# Patient Record
Sex: Female | Born: 1985 | Race: Black or African American | Hispanic: No | Marital: Single | Smoking: Current every day smoker
Health system: Southern US, Community
[De-identification: ages and names within clinical notes are randomized; demographics above are authoritative.]

## PROBLEM LIST (undated history)

## (undated) DIAGNOSIS — D490 Neoplasm of unspecified behavior of digestive system: Secondary | ICD-10-CM

## (undated) DIAGNOSIS — G8929 Other chronic pain: Secondary | ICD-10-CM

## (undated) DIAGNOSIS — K219 Gastro-esophageal reflux disease without esophagitis: Secondary | ICD-10-CM

## (undated) DIAGNOSIS — R109 Unspecified abdominal pain: Secondary | ICD-10-CM

## (undated) HISTORY — PX: PANCREATECTOMY: SHX1019

## (undated) HISTORY — PX: SPLENECTOMY: SUR1306

## (undated) HISTORY — PX: CHOLECYSTECTOMY: SHX55

---

## 2013-06-06 ENCOUNTER — Emergency Department (HOSPITAL_COMMUNITY)
Admission: EM | Admit: 2013-06-06 | Discharge: 2013-06-06 | Disposition: A | Discharge status: Home / Self Care | Attending: Emergency Medicine | Admitting: Emergency Medicine

## 2013-06-06 ENCOUNTER — Encounter (HOSPITAL_COMMUNITY): Payer: Self-pay | Admitting: *Deleted

## 2013-06-06 ENCOUNTER — Emergency Department (INDEPENDENT_AMBULATORY_CARE_PROVIDER_SITE_OTHER)
Admission: EM | Admit: 2013-06-06 | Discharge: 2013-06-06 | Disposition: A | Discharge status: Skilled Nursing Facility | Source: Home / Self Care | Attending: Family Medicine | Admitting: Family Medicine

## 2013-06-06 ENCOUNTER — Emergency Department (HOSPITAL_COMMUNITY)

## 2013-06-06 DIAGNOSIS — R1012 Left upper quadrant pain: Secondary | ICD-10-CM | POA: Insufficient documentation

## 2013-06-06 DIAGNOSIS — K219 Gastro-esophageal reflux disease without esophagitis: Secondary | ICD-10-CM | POA: Insufficient documentation

## 2013-06-06 DIAGNOSIS — R109 Unspecified abdominal pain: Secondary | ICD-10-CM

## 2013-06-06 DIAGNOSIS — R142 Eructation: Secondary | ICD-10-CM | POA: Insufficient documentation

## 2013-06-06 DIAGNOSIS — R110 Nausea: Secondary | ICD-10-CM | POA: Insufficient documentation

## 2013-06-06 DIAGNOSIS — K921 Melena: Secondary | ICD-10-CM | POA: Insufficient documentation

## 2013-06-06 DIAGNOSIS — R141 Gas pain: Secondary | ICD-10-CM | POA: Insufficient documentation

## 2013-06-06 DIAGNOSIS — F17200 Nicotine dependence, unspecified, uncomplicated: Secondary | ICD-10-CM | POA: Insufficient documentation

## 2013-06-06 DIAGNOSIS — G8929 Other chronic pain: Secondary | ICD-10-CM | POA: Insufficient documentation

## 2013-06-06 DIAGNOSIS — R42 Dizziness and giddiness: Secondary | ICD-10-CM | POA: Insufficient documentation

## 2013-06-06 DIAGNOSIS — Z3202 Encounter for pregnancy test, result negative: Secondary | ICD-10-CM | POA: Insufficient documentation

## 2013-06-06 HISTORY — DX: Neoplasm of unspecified behavior of digestive system: D49.0

## 2013-06-06 HISTORY — DX: Gastro-esophageal reflux disease without esophagitis: K21.9

## 2013-06-06 HISTORY — DX: Unspecified abdominal pain: R10.9

## 2013-06-06 HISTORY — DX: Other chronic pain: G89.29

## 2013-06-06 LAB — CBC WITH DIFFERENTIAL/PLATELET
Basophils Absolute: 0.1 10*3/uL (ref 0.0–0.1)
Basophils Relative: 1 % (ref 0–1)
Eosinophils Absolute: 0.5 10*3/uL (ref 0.0–0.7)
Eosinophils Relative: 6 % — ABNORMAL HIGH (ref 0–5)
Lymphocytes Relative: 61 % — ABNORMAL HIGH (ref 12–46)
MCHC: 33.2 g/dL (ref 30.0–36.0)
MCV: 76.2 fL — ABNORMAL LOW (ref 78.0–100.0)
Platelets: 389 10*3/uL (ref 150–400)
RDW: 18.2 % — ABNORMAL HIGH (ref 11.5–15.5)
WBC: 9.3 10*3/uL (ref 4.0–10.5)

## 2013-06-06 LAB — POCT URINALYSIS DIP (DEVICE)
Glucose, UA: NEGATIVE mg/dL
Hgb urine dipstick: NEGATIVE
Nitrite: NEGATIVE
Protein, ur: NEGATIVE mg/dL
Urobilinogen, UA: 0.2 mg/dL (ref 0.0–1.0)

## 2013-06-06 LAB — COMPREHENSIVE METABOLIC PANEL
ALT: 10 U/L (ref 0–35)
AST: 19 U/L (ref 0–37)
Albumin: 4.6 g/dL (ref 3.5–5.2)
CO2: 24 mEq/L (ref 19–32)
Calcium: 10 mg/dL (ref 8.4–10.5)
Sodium: 135 mEq/L (ref 135–145)
Total Protein: 8.8 g/dL — ABNORMAL HIGH (ref 6.0–8.3)

## 2013-06-06 LAB — SEDIMENTATION RATE: Sed Rate: 3 mm/hr (ref 0–22)

## 2013-06-06 MED ORDER — IOHEXOL 300 MG/ML  SOLN
25.0000 mL | INTRAMUSCULAR | Status: AC
  Administered 2013-06-06: 25 mL via ORAL

## 2013-06-06 MED ORDER — ONDANSETRON HCL 4 MG/2ML IJ SOLN
4.0000 mg | Freq: Once | INTRAMUSCULAR | Status: AC
  Administered 2013-06-06: 4 mg via INTRAVENOUS
  Filled 2013-06-06: qty 2

## 2013-06-06 MED ORDER — MORPHINE SULFATE 4 MG/ML IJ SOLN
4.0000 mg | Freq: Once | INTRAMUSCULAR | Status: AC
  Administered 2013-06-06: 4 mg via INTRAVENOUS
  Filled 2013-06-06: qty 1

## 2013-06-06 MED ORDER — IOHEXOL 300 MG/ML  SOLN
100.0000 mL | Freq: Once | INTRAMUSCULAR | Status: AC | PRN
  Administered 2013-06-06: 100 mL via INTRAVENOUS

## 2013-06-06 MED ORDER — ONDANSETRON HCL 4 MG PO TABS: 4.0000 mg | ORAL_TABLET | Freq: Four times a day (QID) | ORAL | Status: DC

## 2013-06-06 NOTE — ED Notes (Signed)
Has been having some LUQ abd pain, but became significantly worse over past 2 days.  Has hx of large abdominal surgery for tumor removal in 07/2012 - was told afterwards non-cancerous.  Has some chronic abd pain.

## 2013-06-06 NOTE — ED Notes (Signed)
Pt up to the bathroom. No distress noted.

## 2013-06-06 NOTE — ED Notes (Addendum)
Urine was taken at [facility].  poc preg was negative

## 2013-06-06 NOTE — ED Provider Notes (Signed)
I saw and evaluated this patient. Examine her abdomen. It is benign. She does not have peritoneal irritation. Her labs and CT scan are reassuring. Plan will be expectant management and followup with her primary care physician.  Claudean Kinds, MD 06/06/13 2028

## 2013-06-06 NOTE — ED Provider Notes (Signed)
CSN: [id number]     Arrival date & time 06/06/13  1442 History   None    Chief Complaint  Patient presents with  . Abdominal Pain   (Consider location/radiation/quality/duration/timing/severity/associated sxs/prior Treatment) Patient is a 27 y.o. female presenting with abdominal pain. The history is provided by the patient. No language interpreter was used.  Abdominal Pain Pain location:  LUQ Pain quality: sharp   Pain radiates to:  Does not radiate Pain severity:  Moderate Onset quality:  Gradual Duration:  2 days Timing:  Constant Progression:  Worsening Chronicity:  Recurrent Context: previous surgery   Context: not recent illness, not recent travel, not sick contacts, not suspicious food intake and not trauma   Relieved by: splinting. Worsened by:  Nothing tried Ineffective treatments:  None tried Associated symptoms: flatus and nausea   Associated symptoms: no chest pain, no cough, no diarrhea, no dysuria, no fever, no hematuria, no shortness of breath, no sore throat, no vaginal bleeding, no vaginal discharge and no vomiting   Risk factors comment:  Previous pancreatectomy, splenectomy for pancreatic cyst   Past Medical History  Diagnosis Date  . Pancreatic tumor   . GERD (gastroesophageal reflux disease)   . Chronic abdominal pain    Past Surgical History  Procedure Laterality Date  . Splenectomy    . Cholecystectomy    . Pancreatectomy      partial   No family history on file. History  Substance Use Topics  . Smoking status: Current Every Day Smoker  . Smokeless tobacco: Not on file  . Alcohol Use: No   OB History   Grav Para Term Preterm Abortions TAB SAB Ect Mult Living                 Review of Systems  Constitutional: Negative for fever.  HENT: Negative for congestion, sore throat and rhinorrhea.   Respiratory: Negative for cough and shortness of breath.   Cardiovascular: Negative for chest pain.  Gastrointestinal: Positive for nausea, abdominal  pain, blood in stool and flatus. Negative for vomiting and diarrhea.  Genitourinary: Negative for dysuria, hematuria, vaginal bleeding and vaginal discharge.  Skin: Negative for rash.  Neurological: Positive for light-headedness. Negative for syncope and headaches.  All other systems reviewed and are negative.    Allergies  Hydrocodone  Home Medications   Current Outpatient Rx  Name  Route  Sig  Dispense  Refill  . DIAZEPAM PO   Oral   Take by mouth.         . Esomeprazole Magnesium (NEXIUM PO)   Oral   Take by mouth.         Marland Kitchen UNKNOWN TO PATIENT      Liquid pain med          BP 118/85  Pulse 58  Temp(Src) 98.8 F (37.1 C) (Oral)  Resp 18  SpO2 100%  LMP 05/15/2013 Physical Exam  Nursing note and vitals reviewed. Constitutional: She is oriented to person, place, and time. She appears well-developed and well-nourished.  HENT:  Head: Normocephalic and atraumatic.  Right Ear: External ear normal.  Left Ear: External ear normal.  Eyes: EOM are normal.  Neck: Normal range of motion. Neck supple.  Cardiovascular: Normal rate, regular rhythm and intact distal pulses.  Exam reveals no gallop and no friction rub.   No murmur heard. Pulmonary/Chest: Effort normal and breath sounds normal. No respiratory distress. She has no wheezes. She has no rales. She exhibits no tenderness.  Abdominal: Soft. Bowel  sounds are normal. She exhibits no distension. There is tenderness (LUQ). There is no rebound.  Musculoskeletal: Normal range of motion. She exhibits no edema and no tenderness.  Lymphadenopathy:    She has no cervical adenopathy.  Neurological: She is alert and oriented to person, place, and time.  Skin: Skin is warm. No rash noted.  Psychiatric: She has a normal mood and affect. Her behavior is normal.    ED Course  Procedures (including critical care time) Labs Review Labs Reviewed  CBC WITH DIFFERENTIAL - Abnormal; Notable for the following:    RBC 5.26 (*)     MCV 76.2 (*)    MCH 25.3 (*)    RDW 18.2 (*)    Neutrophils Relative % 26 (*)    Lymphocytes Relative 61 (*)    Lymphs Abs 5.7 (*)    Eosinophils Relative 6 (*)    All other components within normal limits  COMPREHENSIVE METABOLIC PANEL - Abnormal; Notable for the following:    Potassium 3.4 (*)    Total Protein 8.8 (*)    All other components within normal limits  LIPASE, BLOOD  SEDIMENTATION RATE  OCCULT BLOOD GASTRIC / DUODENUM (SPECIMEN CUP)   Imaging Review Ct Abdomen Pelvis W Contrast  06/06/2013   CLINICAL DATA:  Left-sided abdominal pain and rectal bleeding for the past 2-3 days. Nausea and vomiting.  EXAM: CT ABDOMEN AND PELVIS WITH CONTRAST  TECHNIQUE: Multidetector CT imaging of the abdomen and pelvis was performed using the standard protocol following bolus administration of intravenous contrast.  CONTRAST:  OMNIPAQUE IOHEXOL 300 MG/ML  SOLN  COMPARISON:  No priors.  FINDINGS: Lung Bases: Unremarkable.  Abdomen/Pelvis: Status post cholecystectomy, distal pancreatectomy and splenectomy. The appearance of the liver, bilateral adrenal glands and the left kidney is unremarkable. In the interpolar collecting system of the right kidney there is a 4 mm nonobstructive calculus. Small volume of free fluid in the cul-de-sac is presumably physiologic in this young female patient. Probable degenerating corpus luteum cyst in the left ovary incidentally noted. Uterus and right ovary are unremarkable in appearance. No larger volume of ascites. No pneumoperitoneum. No pathologic distention of small bowel. Normal appendix. No definite pathologic lymphadenopathy identified within the abdomen or pelvis.  Musculoskeletal: There are no aggressive appearing lytic or blastic lesions noted in the visualized portions of the skeleton.  IMPRESSION: 1. No acute findings in the abdomen or pelvis to account for the patient's symptoms. 2. Status post cholecystectomy, distal pancreatectomy and splenectomy. 3.  Normal appendix. 4. Additional incidental findings, as above.   Electronically Signed   By: Trudie Reed M.D.   On: 06/06/2013 18:33    MDM   1. Abdominal pain   2. Nausea    4:07 PM Pt is a 27 y.o. 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 Abdominal pain for 3 days. Described as sharp in nature without radiation. Pain located in LUQ, constant with no relieving factors and worse with nothing. Pt endorses nausea. No vomiting and no diarrhea. Dark red blood of teaspoon amount in stool off and on, no pain with defecation. No history of constipation or hemorrhoids. No Fevers. Last BM today.Not on medications that increase constipation. No family history of UC or Crohn's  On exam: AFVSS: abdomen soft, TTP LUQ. No peritoneal signs. CBC, CMP, lipase,Will obtain CT abdomen pelvis w contrast to rule out possible splenic bed abscess, UC/Crohn's or diverticulosis/Diverticulitis.  Will give morphine and zofran for pain and nausea will also add on ESR  Review of labs: CBc showed no leukocytosis, H&H 13.3/40.1. CMp showed hypokalemia. No elevation of LFTs. Lipase within normal limits. POCT stool hemoccult negative. Pain controlled with morphine. ESR negative  CT abdomen pelvis negative for colitis, UC/Crohn's or splenic bed abscess. No appendicitis noted. Plan for discharge with close follow up with PCP for possible GI outpatient follow up.  8:13 PM: I have discussed the diagnosis/risks/treatment options with the patient and believe the pt to be eligible for discharge home to follow-up with PCP in 1 week for possible outpatient GI follow up. We also discussed returning to the ED immediately if new or worsening sx occur. We discussed the sx which are most concerning (e.g., worsening symptoms) that necessitate immediate return. Any new prescriptions provided to the patient are listed below.   New  Prescriptions   ONDANSETRON (ZOFRAN) 4 MG TABLET    Take 1 tablet (4 mg total) by mouth every 6 (six) hours.   The patient appears reasonably screened and/or stabilized for discharge and I doubt any other medical condition or other [facility] requiring further screening, evaluation or treatment in the ED at this time prior to discharge . Pt in agreement with discharge plan. Return precautions given. Pt discharged VSS  Labs and imaging reviewed by myself and considered in medical decision making if ordered.  Imaging interpreted by radiology. Pt was discussed with my attending, Dr. Marlene Bast, MD 06/06/13 [phone]

## 2013-06-06 NOTE — ED Provider Notes (Signed)
[name] [name] is a 27 y.o. female who presents to Urgent Care today for abdominal pain. 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 She had a splenectomy cholecystectomy and partial pancreas removal in November 2013 at [place].  This reportedly was curative. She did not have chemotherapy or radiation therapy.  Her surgical course was complicated with a prolonged Penrose drain that took about one month to stop draining. However since December she has had only mild occasional abdominal pain. Over the last several days her abdominal pain has worsened and is now associated with bright red blood per rectum.  She denies any fevers or chills nausea vomiting or diarrhea. She has not yet established with a Dr. here in [place].  The surgery was performed when she was a member of the [employer]. 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 The abdominal pain is located in the left upper quadrant and left lower quadrants. The pain is now moderate to severe.    Past Medical History  Diagnosis Date  . Pancreatic tumor   . GERD (gastroesophageal reflux disease)   . Chronic abdominal pain    History  Substance Use Topics  . Smoking status: Current Every Day Smoker  . Smokeless tobacco: Not on file  . Alcohol Use: No   ROS as above Medications reviewed. No current facility-administered medications for this encounter.   Current Outpatient Prescriptions  Medication Sig Dispense Refill  . DIAZEPAM PO Take by mouth.      . Esomeprazole Magnesium (NEXIUM PO) Take by mouth.      Marland Kitchen UNKNOWN TO PATIENT Liquid pain med        Exam:  BP 149/99  Pulse 61  Temp(Src) 98.5 F (36.9 C) (Oral)  Resp 16  SpO2 99%  LMP 05/15/2013  Gen: Well NAD HEENT: EOMI,  MMM Lungs: CTABL Nl WOB Heart: RRR no MRG Abd: Hypoactive bowel sounds. Large mature scar present on the upper left portion of her abdomen. Tender to palpation upper left quadrant with rebound and  guarding present.  Exts: Non edematous BL  LE, warm and well perfused.   Results for orders placed during the hospital encounter of 06/06/13 (from the past 24 hour(s))  POCT URINALYSIS DIP (DEVICE)     Status: Abnormal   Collection Time    06/06/13  2:12 PM      Result Value Range   Glucose, UA NEGATIVE  NEGATIVE mg/dL   Bilirubin Urine NEGATIVE  NEGATIVE   Ketones, ur NEGATIVE  NEGATIVE mg/dL   Specific Gravity, Urine 1.015  1.005 - 1.030   Hgb urine dipstick NEGATIVE  NEGATIVE   pH 7.0  5.0 - 8.0   Protein, ur NEGATIVE  NEGATIVE mg/dL   Urobilinogen, UA 0.2  0.0 - 1.0 mg/dL   Nitrite NEGATIVE  NEGATIVE   Leukocytes, UA TRACE (*) NEGATIVE  POCT PREGNANCY, URINE     Status: None   Collection Time    06/06/13  2:34 PM      Result Value Range   Preg Test, Ur NEGATIVE  NEGATIVE   No results found.  Assessment and Plan: 27 y.o. female with worsening abdominal pain, blood in stool in the setting of an extensive surgical procedure for a unknown tumor.  Patient's exam is somewhat concerning. I feel that she needs more evaluation and management than we are able to provide at the [facility] urgent care. She may have a bile leak, infection, adhesion, colitis  or malignant spread.  Plan to transfer to the emergency room via shuttle.    Rodolph Bong, MD 06/06/13 [phone]

## 2013-06-06 NOTE — ED Notes (Signed)
Pt has had 2-3 days of left sided abdominal pain with rectal bleeding (dark blood with bm x1 this am). Pt had some nausea and vomiting Friday.  Pt has hx splenectomy and "half my pancreas taken out" in 11/13 and pt feels that her symptoms at this time are just like before her surgeries.

## 2013-06-07 LAB — POCT PREGNANCY, URINE: Preg Test, Ur: NEGATIVE

## 2013-06-16 NOTE — ED Provider Notes (Signed)
.  I saw and evaluated the patient, reviewed the resident's note and I agree with the findings and plan.  Patient seen and examined. Primarily nausea and some diffuse abdominal pain. Significant history with splenectomy and pancreatitis. Exam shows no peritoneal irritation. Findings are reassuring. His improving. Plan will be close attention if symptoms recheck if not improving or if worsening or localizing pain.  Roney Marion, MD 06/16/13 1059

## 2013-10-27 ENCOUNTER — Encounter (HOSPITAL_COMMUNITY): Payer: Self-pay | Admitting: Emergency Medicine

## 2013-10-27 DIAGNOSIS — Z3202 Encounter for pregnancy test, result negative: Secondary | ICD-10-CM | POA: Insufficient documentation

## 2013-10-27 DIAGNOSIS — F17200 Nicotine dependence, unspecified, uncomplicated: Secondary | ICD-10-CM | POA: Insufficient documentation

## 2013-10-27 DIAGNOSIS — Z79899 Other long term (current) drug therapy: Secondary | ICD-10-CM | POA: Insufficient documentation

## 2013-10-27 DIAGNOSIS — N390 Urinary tract infection, site not specified: Secondary | ICD-10-CM | POA: Insufficient documentation

## 2013-10-27 DIAGNOSIS — K219 Gastro-esophageal reflux disease without esophagitis: Secondary | ICD-10-CM | POA: Insufficient documentation

## 2013-10-27 DIAGNOSIS — K5289 Other specified noninfective gastroenteritis and colitis: Secondary | ICD-10-CM | POA: Insufficient documentation

## 2013-10-27 DIAGNOSIS — Z9089 Acquired absence of other organs: Secondary | ICD-10-CM | POA: Insufficient documentation

## 2013-10-27 MED ORDER — ONDANSETRON 4 MG PO TBDP
8.0000 mg | ORAL_TABLET | Freq: Once | ORAL | Status: AC
  Administered 2013-10-27: 8 mg via ORAL
  Filled 2013-10-27: qty 2

## 2013-10-27 NOTE — ED Notes (Signed)
Pt vomiting in waiting room; emesis bag given and placed protocol order for zofran

## 2013-10-27 NOTE — ED Notes (Signed)
Patient states she has been vomiting off and on for 3 days and today it got worse

## 2013-10-28 ENCOUNTER — Emergency Department (HOSPITAL_COMMUNITY)
Admission: EM | Admit: 2013-10-28 | Discharge: 2013-10-28 | Disposition: A | Discharge status: Home / Self Care | Payer: Non-veteran care | Attending: Emergency Medicine | Admitting: Emergency Medicine

## 2013-10-28 DIAGNOSIS — R112 Nausea with vomiting, unspecified: Secondary | ICD-10-CM

## 2013-10-28 DIAGNOSIS — N390 Urinary tract infection, site not specified: Secondary | ICD-10-CM

## 2013-10-28 DIAGNOSIS — K529 Noninfective gastroenteritis and colitis, unspecified: Secondary | ICD-10-CM

## 2013-10-28 LAB — URINE MICROSCOPIC-ADD ON

## 2013-10-28 LAB — COMPREHENSIVE METABOLIC PANEL
ALBUMIN: 4.1 g/dL (ref 3.5–5.2)
ALT: 14 U/L (ref 0–35)
AST: 17 U/L (ref 0–37)
Alkaline Phosphatase: 75 U/L (ref 39–117)
BUN: 15 mg/dL (ref 6–23)
CALCIUM: 9.1 mg/dL (ref 8.4–10.5)
CO2: 19 meq/L (ref 19–32)
CREATININE: 0.64 mg/dL (ref 0.50–1.10)
Chloride: 101 mEq/L (ref 96–112)
GFR calc Af Amer: >90 mL/min (ref >90)
Glucose, Bld: 121 mg/dL — ABNORMAL HIGH (ref 70–99)
Potassium: 3.7 mEq/L (ref 3.7–5.3)
Sodium: 137 mEq/L (ref 137–147)
TOTAL PROTEIN: 7.8 g/dL (ref 6.0–8.3)
Total Bilirubin: 0.7 mg/dL (ref 0.3–1.2)

## 2013-10-28 LAB — URINALYSIS, ROUTINE W REFLEX MICROSCOPIC
Bilirubin Urine: NEGATIVE
Glucose, UA: NEGATIVE mg/dL
KETONES UR: 40 mg/dL — AB
NITRITE: POSITIVE — AB
PH: 5.5 (ref 5.0–8.0)
PROTEIN: NEGATIVE mg/dL
Specific Gravity, Urine: 1.03 (ref 1.005–1.030)
Urobilinogen, UA: 0.2 mg/dL (ref 0.0–1.0)

## 2013-10-28 LAB — LIPASE, BLOOD: Lipase: 15 U/L (ref 11–59)

## 2013-10-28 LAB — CBC
HCT: 37.1 % (ref 36.0–46.0)
Hemoglobin: 13 g/dL (ref 12.0–15.0)
MCH: 27.8 pg (ref 26.0–34.0)
MCHC: 35 g/dL (ref 30.0–36.0)
MCV: 79.4 fL (ref 78.0–100.0)
PLATELETS: 325 10*3/uL (ref 150–400)
RBC: 4.67 MIL/uL (ref 3.87–5.11)
RDW: 15.6 % — AB (ref 11.5–15.5)
WBC: 9.4 10*3/uL (ref 4.0–10.5)

## 2013-10-28 LAB — PREGNANCY, URINE: PREG TEST UR: NEGATIVE

## 2013-10-28 MED ORDER — ONDANSETRON 4 MG PO TBDP
ORAL_TABLET | ORAL | Status: AC
  Filled 2013-10-28: qty 1

## 2013-10-28 MED ORDER — CEPHALEXIN 500 MG PO CAPS: 500.0000 mg | ORAL_CAPSULE | Freq: Four times a day (QID) | ORAL | Status: DC

## 2013-10-28 MED ORDER — ONDANSETRON 4 MG PO TBDP
4.0000 mg | ORAL_TABLET | Freq: Once | ORAL | Status: AC
  Administered 2013-10-28: 4 mg via ORAL

## 2013-10-28 MED ORDER — SODIUM CHLORIDE 0.9 % IV BOLUS (SEPSIS)
1000.0000 mL | Freq: Once | INTRAVENOUS | Status: AC
  Administered 2013-10-28: 1000 mL via INTRAVENOUS

## 2013-10-28 MED ORDER — ONDANSETRON HCL 8 MG PO TABS: 8.0000 mg | ORAL_TABLET | Freq: Three times a day (TID) | ORAL | Status: DC | PRN

## 2013-10-28 MED ORDER — ONDANSETRON HCL 4 MG/2ML IJ SOLN
4.0000 mg | Freq: Once | INTRAMUSCULAR | Status: AC
  Administered 2013-10-28: 4 mg via INTRAVENOUS
  Filled 2013-10-28: qty 2

## 2013-10-28 MED ORDER — SODIUM CHLORIDE 0.9 % IV BOLUS (SEPSIS)
500.0000 mL | Freq: Once | INTRAVENOUS | Status: AC
  Administered 2013-10-28: 500 mL via INTRAVENOUS

## 2013-10-28 NOTE — ED Provider Notes (Signed)
CSN: [id number]     Arrival date & time 10/27/13  2228 History   First MD Initiated Contact with Patient 10/28/13 [phone]     Chief Complaint  Patient presents with  . Emesis   (Consider location/radiation/quality/duration/timing/severity/associated sxs/prior Treatment) Patient is a 28 y.o. female presenting with vomiting. The history is provided by the patient.  Emesis Associated symptoms: no abdominal pain, no chills, no diarrhea, no headaches and no sore throat   pt c/o nv for the past day, several episodes, clear to yellowish, not bloody or bilious. Mid abd crampy discomfort. No constant/focal pain. No diarrhea or constipation, having normal bms. No lower abd or pelvic pain. No vaginal discharge or bleeding. lnmp 1 month ago. No dysuria or flank pain. No fever or chills. No known ill contacts or bad food ingestion.      Past Medical History  Diagnosis Date  . Pancreatic tumor   . GERD (gastroesophageal reflux disease)   . Chronic abdominal pain    Past Surgical History  Procedure Laterality Date  . Splenectomy    . Cholecystectomy    . Pancreatectomy      partial   History reviewed. No pertinent family history. History  Substance Use Topics  . Smoking status: Current Every Day Smoker  . Smokeless tobacco: Not on file  . Alcohol Use: No   OB History   Grav Para Term Preterm Abortions TAB SAB Ect Mult Living                 Review of Systems  Constitutional: Negative for fever and chills.  HENT: Negative for sore throat.   Eyes: Negative for redness.  Respiratory: Negative for cough and shortness of breath.   Cardiovascular: Negative for chest pain.  Gastrointestinal: Positive for vomiting. Negative for abdominal pain, diarrhea and constipation.  Genitourinary: Negative for dysuria, flank pain, vaginal bleeding and vaginal discharge.  Musculoskeletal: Negative for back pain and neck pain.  Skin: Negative for rash.  Neurological: Negative for headaches.   Hematological: Does not bruise/bleed easily.  Psychiatric/Behavioral: Negative for confusion.    Allergies  Strawberry and Hydrocodone  Home Medications   Current Outpatient Rx  Name  Route  Sig  Dispense  Refill  . esomeprazole (NEXIUM) 40 MG capsule   Oral   Take 40 mg by mouth daily before breakfast.          BP 124/74  Pulse 69  Temp(Src) 98.1 F (36.7 C) (Oral)  Resp 18  Ht 5\' 7"  (1.702 m)  Wt 138 lb (62.596 kg)  BMI 21.61 kg/m2  SpO2 99%  LMP 09/25/2013 Physical Exam  Nursing note and vitals reviewed. Constitutional: She appears well-developed and well-nourished. No distress.  HENT:  Mouth/Throat: Oropharynx is clear and moist.  Eyes: Conjunctivae are normal. No scleral icterus.  Neck: Neck supple. No tracheal deviation present.  Cardiovascular: Normal rate, regular rhythm, normal heart sounds and intact distal pulses.   Pulmonary/Chest: Effort normal and breath sounds normal. No respiratory distress.  Abdominal: Soft. Normal appearance and bowel sounds are normal. She exhibits no distension and no mass. There is no tenderness. There is no rebound and no guarding.  Healed surgical scars, no incarcerated hernia.   Genitourinary:  No cva tenderness  Musculoskeletal: She exhibits no edema.  Neurological: She is alert.  Skin: Skin is warm and dry. No rash noted.  Psychiatric: She has a normal mood and affect.    ED Course  Procedures (including critical care time)   Results for  orders placed during the hospital encounter of 10/28/13  CBC      Result Value Range   WBC 9.4  4.0 - 10.5 K/uL   RBC 4.67  3.87 - 5.11 MIL/uL   Hemoglobin 13.0  12.0 - 15.0 g/dL   HCT 37.1  36.0 - 46.0 %   MCV 79.4  78.0 - 100.0 fL   MCH 27.8  26.0 - 34.0 pg   MCHC 35.0  30.0 - 36.0 g/dL   RDW 15.6 (*) 11.5 - 15.5 %   Platelets 325  150 - 400 K/uL  COMPREHENSIVE METABOLIC PANEL      Result Value Range   Sodium 137  137 - 147 mEq/L   Potassium 3.7  3.7 - 5.3 mEq/L    Chloride 101  96 - 112 mEq/L   CO2 19  19 - 32 mEq/L   Glucose, Bld 121 (*) 70 - 99 mg/dL   BUN 15  6 - 23 mg/dL   Creatinine, Ser 0.64  0.50 - 1.10 mg/dL   Calcium 9.1  8.4 - 10.5 mg/dL   Total Protein 7.8  6.0 - 8.3 g/dL   Albumin 4.1  3.5 - 5.2 g/dL   AST 17  0 - 37 U/L   ALT 14  0 - 35 U/L   Alkaline Phosphatase 75  39 - 117 U/L   Total Bilirubin 0.7  0.3 - 1.2 mg/dL   GFR calc non Af Amer >90  >90 mL/min   GFR calc Af Amer >90  >90 mL/min  LIPASE, BLOOD      Result Value Range   Lipase 15  11 - 59 U/L  PREGNANCY, URINE      Result Value Range   Preg Test, Ur NEGATIVE  NEGATIVE  URINALYSIS, ROUTINE W REFLEX MICROSCOPIC      Result Value Range   Color, Urine AMBER (*) YELLOW   APPearance CLOUDY (*) CLEAR   Specific Gravity, Urine 1.030  1.005 - 1.030   pH 5.5  5.0 - 8.0   Glucose, UA NEGATIVE  NEGATIVE mg/dL   Hgb urine dipstick SMALL (*) NEGATIVE   Bilirubin Urine NEGATIVE  NEGATIVE   Ketones, ur 40 (*) NEGATIVE mg/dL   Protein, ur NEGATIVE  NEGATIVE mg/dL   Urobilinogen, UA 0.2  0.0 - 1.0 mg/dL   Nitrite POSITIVE (*) NEGATIVE   Leukocytes, UA TRACE (*) NEGATIVE  URINE MICROSCOPIC-ADD ON      Result Value Range   Squamous Epithelial / LPF RARE  RARE   WBC, UA 0-2  <3 WBC/hpf   RBC / HPF 3-6  <3 RBC/hpf   Bacteria, UA MANY (*) RARE      MDM  Iv ns bolus. zofran iv. Labs.  Reviewed nursing notes and prior charts for additional history.    Additional iv ns bolus.   Recheck abd soft, no tenderness. No recurrent nv.   ua w nitrite pos, le pos, many bact - will cs and rx.   Pt remains feeling improved. abd soft nt. No nv.    Mirna Mires, MD 10/28/13 [phone]

## 2013-10-28 NOTE — ED Notes (Signed)
Pt states that her nausea has "slowed down" since receiving the Zofran. Pt states that she has not thrown up as much since taking Zofran.

## 2013-10-28 NOTE — Discharge Instructions (Signed)
Rest. Drink plenty of fluids. Take zofran as need for nausea. Your lab tests show a possible urine infection. Take antibiotic (keflex) as prescribed. A urine culture was sent the results of which will be back in 2 days time - have primary care doctor follow up on that result then. Follow up with primary care doctor, or here, in 1 days time for recheck if symptoms fail to improve/resolve. Return to ER right away if worse, persistent vomiting, worsening or severe abdominal pain, other concern.    Nausea and Vomiting Nausea is a sick feeling that often comes before throwing up (vomiting). Vomiting is a reflex where stomach contents come out of your mouth. Vomiting can cause severe loss of body fluids (dehydration). Children and elderly adults can become dehydrated quickly, especially if they also have diarrhea. Nausea and vomiting are symptoms of a condition or disease. It is important to find the cause of your symptoms. CAUSES   Direct irritation of the stomach lining. This irritation can result from increased acid production (gastroesophageal reflux disease), infection, food poisoning, taking certain medicines (such as nonsteroidal anti-inflammatory drugs), alcohol use, or tobacco use.  Signals from the brain.These signals could be caused by a headache, heat exposure, an inner ear disturbance, increased pressure in the brain from injury, infection, a tumor, or a concussion, pain, emotional stimulus, or metabolic problems.  An obstruction in the gastrointestinal tract (bowel obstruction).  Illnesses such as diabetes, hepatitis, gallbladder problems, appendicitis, kidney problems, cancer, sepsis, atypical symptoms of a heart attack, or eating disorders.  Medical treatments such as chemotherapy and radiation.  Receiving medicine that makes you sleep (general anesthetic) during surgery. DIAGNOSIS Your caregiver may ask for tests to be done if the problems do not improve after a few days. Tests  may also be done if symptoms are severe or if the reason for the nausea and vomiting is not clear. Tests may include:  Urine tests.  Blood tests.  Stool tests.  Cultures (to look for evidence of infection).  X-rays or other imaging studies. Test results can help your caregiver make decisions about treatment or the need for additional tests. TREATMENT You need to stay well hydrated. Drink frequently but in small amounts.You may wish to drink water, sports drinks, clear broth, or eat frozen ice pops or gelatin dessert to help stay hydrated.When you eat, eating slowly may help prevent nausea.There are also some antinausea medicines that may help prevent nausea. HOME CARE INSTRUCTIONS   Take all medicine as directed by your caregiver.  If you do not have an appetite, do not force yourself to eat. However, you must continue to drink fluids.  If you have an appetite, eat a normal diet unless your caregiver tells you differently.  Eat a variety of complex carbohydrates (rice, wheat, potatoes, bread), lean meats, yogurt, fruits, and vegetables.  Avoid high-fat foods because they are more difficult to digest.  Drink enough water and fluids to keep your urine clear or pale yellow.  If you are dehydrated, ask your caregiver for specific rehydration instructions. Signs of dehydration may include:  Severe thirst.  Dry lips and mouth.  Dizziness.  Dark urine.  Decreasing urine frequency and amount.  Confusion.  Rapid breathing or pulse. SEEK IMMEDIATE MEDICAL CARE IF:   You have blood or brown flecks (like coffee grounds) in your vomit.  You have black or bloody stools.  You have a severe headache or stiff neck.  You are confused.  You have severe abdominal pain.  You  have chest pain or trouble breathing.  You do not urinate at least once every 8 hours.  You develop cold or clammy skin.  You continue to vomit for longer than 24 to 48 hours.  You have a fever. MAKE  SURE YOU:   Understand these instructions.  Will watch your condition.  Will get help right away if you are not doing well or get worse. Document Released: 09/09/2005 Document Revised: 12/02/2011 Document Reviewed: 02/06/2011 [facility] Patient Information 2014 [place], [place].    Urinary Tract Infection Urinary tract infections (UTIs) can develop anywhere along your urinary tract. Your urinary tract is your body's drainage system for removing wastes and extra water. Your urinary tract includes two kidneys, two ureters, a bladder, and a urethra. Your kidneys are a pair of bean-shaped organs. Each kidney is about the size of your fist. They are located below your ribs, one on each side of your spine. CAUSES Infections are caused by microbes, which are microscopic organisms, including fungi, viruses, and bacteria. These organisms are so small that they can only be seen through a microscope. Bacteria are the microbes that most commonly cause UTIs. SYMPTOMS  Symptoms of UTIs may vary by age and gender of the patient and by the location of the infection. Symptoms in young women typically include a frequent and intense urge to urinate and a painful, burning feeling in the bladder or urethra during urination. Older women and men are more likely to be tired, shaky, and weak and have muscle aches and abdominal pain. A fever may mean the infection is in your kidneys. Other symptoms of a kidney infection include pain in your back or sides below the ribs, nausea, and vomiting. DIAGNOSIS To diagnose a UTI, your caregiver will ask you about your symptoms. Your caregiver also will ask to provide a urine sample. The urine sample will be tested for bacteria and white blood cells. White blood cells are made by your body to help fight infection. TREATMENT  Typically, UTIs can be treated with medication. Because most UTIs are caused by a bacterial infection, they usually can be treated with the use of antibiotics.  The choice of antibiotic and length of treatment depend on your symptoms and the type of bacteria causing your infection. HOME CARE INSTRUCTIONS  If you were prescribed antibiotics, take them exactly as your caregiver instructs you. Finish the medication even if you feel better after you have only taken some of the medication.  Drink enough water and fluids to keep your urine clear or pale yellow.  Avoid caffeine, tea, and carbonated beverages. They tend to irritate your bladder.  Empty your bladder often. Avoid holding urine for long periods of time.  Empty your bladder before and after sexual intercourse.  After a bowel movement, women should cleanse from front to back. Use each tissue only once. SEEK MEDICAL CARE IF:   You have back pain.  You develop a fever.  Your symptoms do not begin to resolve within 3 days. SEEK IMMEDIATE MEDICAL CARE IF:   You have severe back pain or lower abdominal pain.  You develop chills.  You have nausea or vomiting.  You have continued burning or discomfort with urination. MAKE SURE YOU:   Understand these instructions.  Will watch your condition.  Will get help right away if you are not doing well or get worse. Document Released: 06/19/2005 Document Revised: 03/10/2012 Document Reviewed: 10/18/2011 [facility] Patient Information 2014 [place].    Abdominal Pain, Adult Many things  can cause abdominal pain. Usually, abdominal pain is not caused by a disease and will improve without treatment. It can often be observed and treated at home. Your health care provider will do a physical exam and possibly order blood tests and X-rays to help determine the seriousness of your pain. However, in many cases, more time must pass before a clear cause of the pain can be found. Before that point, your health care provider may not know if you need more testing or further treatment. HOME CARE INSTRUCTIONS  Monitor your abdominal pain for any changes.  The following actions may help to alleviate any discomfort you are experiencing:  Only take over-the-counter or prescription medicines as directed by your health care provider.  Do not take laxatives unless directed to do so by your health care provider.  Try a clear liquid diet (broth, tea, or water) as directed by your health care provider. Slowly move to a bland diet as tolerated. SEEK MEDICAL CARE IF:  You have unexplained abdominal pain.  You have abdominal pain associated with nausea or diarrhea.  You have pain when you urinate or have a bowel movement.  You experience abdominal pain that wakes you in the night.  You have abdominal pain that is worsened or improved by eating food.  You have abdominal pain that is worsened with eating fatty foods. SEEK IMMEDIATE MEDICAL CARE IF:   Your pain does not go away within 2 hours.  You have a fever.  You keep throwing up (vomiting).  Your pain is felt only in portions of the abdomen, such as the right side or the left lower portion of the abdomen.  You pass bloody or black tarry stools. MAKE SURE YOU:  Understand these instructions.   Will watch your condition.   Will get help right away if you are not doing well or get worse.  Document Released: 06/19/2005 Document Revised: 06/30/2013 Document Reviewed: 05/19/2013 [facility] Patient Information 2014 [place].

## 2013-10-30 LAB — URINE CULTURE: Colony Count: >=100000

## 2013-10-31 ENCOUNTER — Telehealth (HOSPITAL_COMMUNITY): Payer: Self-pay | Admitting: Emergency Medicine

## 2013-10-31 NOTE — ED Notes (Signed)
Post ED Visit - Positive Culture Follow-up  Culture report reviewed by antimicrobial stewardship pharmacist: []  Wes [place], Pharm.D., BCPS [x]  Heide Guile, Pharm.D., BCPS []  Alycia Rossetti, Pharm.D., BCPS []  [place], Pharm.D., BCPS, AAHIVP []  Legrand Como, Pharm.D., BCPS, AAHIVP  Positive urine culture Treated with Keflex, organism sensitive to the same and no further patient follow-up is required at this time.  Matthew Saras, Rex Kras 10/31/2013, 9:50 AM

## 2014-04-23 ENCOUNTER — Emergency Department (HOSPITAL_COMMUNITY)
Admission: EM | Admit: 2014-04-23 | Discharge: 2014-04-23 | Disposition: A | Discharge status: Home / Self Care | Payer: Self-pay | Attending: Emergency Medicine | Admitting: Emergency Medicine

## 2014-04-23 ENCOUNTER — Emergency Department (HOSPITAL_COMMUNITY): Payer: Self-pay

## 2014-04-23 ENCOUNTER — Emergency Department (HOSPITAL_COMMUNITY)

## 2014-04-23 ENCOUNTER — Encounter (HOSPITAL_COMMUNITY): Payer: Self-pay | Admitting: Emergency Medicine

## 2014-04-23 DIAGNOSIS — G8929 Other chronic pain: Secondary | ICD-10-CM | POA: Insufficient documentation

## 2014-04-23 DIAGNOSIS — N83209 Unspecified ovarian cyst, unspecified side: Secondary | ICD-10-CM | POA: Insufficient documentation

## 2014-04-23 DIAGNOSIS — N390 Urinary tract infection, site not specified: Secondary | ICD-10-CM | POA: Insufficient documentation

## 2014-04-23 DIAGNOSIS — R109 Unspecified abdominal pain: Secondary | ICD-10-CM | POA: Insufficient documentation

## 2014-04-23 DIAGNOSIS — Z3202 Encounter for pregnancy test, result negative: Secondary | ICD-10-CM | POA: Insufficient documentation

## 2014-04-23 DIAGNOSIS — Z8719 Personal history of other diseases of the digestive system: Secondary | ICD-10-CM | POA: Insufficient documentation

## 2014-04-23 DIAGNOSIS — Z9889 Other specified postprocedural states: Secondary | ICD-10-CM | POA: Insufficient documentation

## 2014-04-23 DIAGNOSIS — Z9089 Acquired absence of other organs: Secondary | ICD-10-CM | POA: Insufficient documentation

## 2014-04-23 DIAGNOSIS — F17200 Nicotine dependence, unspecified, uncomplicated: Secondary | ICD-10-CM | POA: Insufficient documentation

## 2014-04-23 LAB — WET PREP, GENITAL
Trich, Wet Prep: NONE SEEN
Yeast Wet Prep HPF POC: NONE SEEN

## 2014-04-23 LAB — COMPREHENSIVE METABOLIC PANEL
ALBUMIN: 4.2 g/dL (ref 3.5–5.2)
ALK PHOS: 106 U/L (ref 39–117)
ALT: 9 U/L (ref 0–35)
AST: 15 U/L (ref 0–37)
Anion gap: 16 — ABNORMAL HIGH (ref 5–15)
BUN: 12 mg/dL (ref 6–23)
CO2: 22 mEq/L (ref 19–32)
Calcium: 9.9 mg/dL (ref 8.4–10.5)
Chloride: 94 mEq/L — ABNORMAL LOW (ref 96–112)
Creatinine, Ser: 0.74 mg/dL (ref 0.50–1.10)
GFR calc non Af Amer: >90 mL/min (ref >90)
GLUCOSE: 107 mg/dL — AB (ref 70–99)
POTASSIUM: 4.1 meq/L (ref 3.7–5.3)
Sodium: 132 mEq/L — ABNORMAL LOW (ref 137–147)
TOTAL PROTEIN: 9.2 g/dL — AB (ref 6.0–8.3)
Total Bilirubin: 0.9 mg/dL (ref 0.3–1.2)

## 2014-04-23 LAB — CBC WITH DIFFERENTIAL/PLATELET
BASOS ABS: 0 10*3/uL (ref 0.0–0.1)
Basophils Relative: 0 % (ref 0–1)
Eosinophils Absolute: 0.1 10*3/uL (ref 0.0–0.7)
Eosinophils Relative: 1 % (ref 0–5)
HCT: 38.5 % (ref 36.0–46.0)
Hemoglobin: 13.4 g/dL (ref 12.0–15.0)
LYMPHS ABS: 3.5 10*3/uL (ref 0.7–4.0)
LYMPHS PCT: 21 % (ref 12–46)
MCH: 28.3 pg (ref 26.0–34.0)
MCHC: 34.8 g/dL (ref 30.0–36.0)
MCV: 81.4 fL (ref 78.0–100.0)
Monocytes Absolute: 1.4 10*3/uL — ABNORMAL HIGH (ref 0.1–1.0)
Monocytes Relative: 8 % (ref 3–12)
NEUTROS PCT: 70 % (ref 43–77)
Neutro Abs: 11.5 10*3/uL — ABNORMAL HIGH (ref 1.7–7.7)
Platelets: 347 10*3/uL (ref 150–400)
RBC: 4.73 MIL/uL (ref 3.87–5.11)
RDW: 13.7 % (ref 11.5–15.5)
WBC: 16.5 10*3/uL — AB (ref 4.0–10.5)

## 2014-04-23 LAB — POC URINE PREG, ED: PREG TEST UR: NEGATIVE

## 2014-04-23 LAB — CBG MONITORING, ED: Glucose-Capillary: 105 mg/dL — ABNORMAL HIGH (ref 70–99)

## 2014-04-23 LAB — URINE MICROSCOPIC-ADD ON

## 2014-04-23 LAB — URINALYSIS, ROUTINE W REFLEX MICROSCOPIC
Bilirubin Urine: NEGATIVE
Glucose, UA: NEGATIVE mg/dL
Ketones, ur: >80 mg/dL — AB
NITRITE: POSITIVE — AB
PH: 6 (ref 5.0–8.0)
Protein, ur: 30 mg/dL — AB
SPECIFIC GRAVITY, URINE: 1.026 (ref 1.005–1.030)
Urobilinogen, UA: 0.2 mg/dL (ref 0.0–1.0)

## 2014-04-23 LAB — LIPASE, BLOOD: Lipase: 10 U/L — ABNORMAL LOW (ref 11–59)

## 2014-04-23 MED ORDER — HYDROMORPHONE HCL PF 1 MG/ML IJ SOLN
1.0000 mg | Freq: Once | INTRAMUSCULAR | Status: AC
  Administered 2014-04-23: 1 mg via INTRAVENOUS
  Filled 2014-04-23: qty 1

## 2014-04-23 MED ORDER — SODIUM CHLORIDE 0.9 % IV BOLUS (SEPSIS)
1000.0000 mL | Freq: Once | INTRAVENOUS | Status: AC
  Administered 2014-04-23: 1000 mL via INTRAVENOUS

## 2014-04-23 MED ORDER — IOHEXOL 300 MG/ML  SOLN
25.0000 mL | INTRAMUSCULAR | Status: AC | PRN
  Administered 2014-04-23 (×2): 25 mL via ORAL

## 2014-04-23 MED ORDER — IOHEXOL 300 MG/ML  SOLN
80.0000 mL | Freq: Once | INTRAMUSCULAR | Status: AC | PRN
  Administered 2014-04-23: 80 mL via INTRAVENOUS

## 2014-04-23 MED ORDER — CEPHALEXIN 500 MG PO CAPS: 500.0000 mg | ORAL_CAPSULE | Freq: Four times a day (QID) | ORAL | Status: DC

## 2014-04-23 MED ORDER — ONDANSETRON HCL 4 MG/2ML IJ SOLN
4.0000 mg | Freq: Once | INTRAMUSCULAR | Status: AC
Start: 2014-04-23 — End: 2014-04-23
  Administered 2014-04-23: 4 mg via INTRAVENOUS
  Filled 2014-04-23: qty 2

## 2014-04-23 MED ORDER — OXYCODONE-ACETAMINOPHEN 5-325 MG PO TABS: 1.0000 | ORAL_TABLET | ORAL | Status: DC | PRN

## 2014-04-23 MED ORDER — DEXTROSE 5 % IV SOLN
1.0000 g | Freq: Once | INTRAVENOUS | Status: AC
  Administered 2014-04-23: 1 g via INTRAVENOUS
  Filled 2014-04-23: qty 10

## 2014-04-23 NOTE — ED Provider Notes (Signed)
CSN: [id number]     Arrival date & time 04/23/14  0865 History   First MD Initiated Contact with Patient 04/23/14 0830     Chief Complaint  Patient presents with  . Abdominal Pain     (Consider location/radiation/quality/duration/timing/severity/associated sxs/prior Treatment) HPI  [name] [name] 28 year old female with a past medical history of pancreatic tumor, reflux, chronic abdominal pain. She is a past surgical history of splenectomy, partial pancreatectomy, and cholecystectomy. Patient complains of 4 days of worsening acute on chronic abdominal pain with intermittent diarrhea. She complains of umbilical pain which radiates to the right and left upper quadrant. She states that the pain is waxing and waning, feels like a sensation of "pulling" in her abdomen. She denies nausea or vomiting. Last menstrual period again 04/13/2014. She states she's still having some spotting. She denies any lower abdominal pain, urinary or vaginal symptoms. She denies lower back pain. She has subjective fevers at home. Her stool is characterized as brown and watery. Last episode was this morning. She tried Aleve at home without relief of her symptoms.  Past Medical History  Diagnosis Date  . Pancreatic tumor   . GERD (gastroesophageal reflux disease)   . Chronic abdominal pain    Past Surgical History  Procedure Laterality Date  . Splenectomy    . Cholecystectomy    . Pancreatectomy      partial   No family history on file. History  Substance Use Topics  . Smoking status: Current Every Day Smoker  . Smokeless tobacco: Not on file  . Alcohol Use: No   OB History   Grav Para Term Preterm Abortions TAB SAB Ect Mult Living                 Review of Systems  Ten systems reviewed and are negative for acute change, except as noted in the HPI.    Allergies  Strawberry and Hydrocodone  Home Medications   Prior to Admission medications   Not on File   BP 116/77  Pulse 86  Temp(Src) 98.1  F (36.7 C) (Oral)  Resp 15  SpO2 100%  LMP 04/11/2014 Physical Exam Physical Exam  Nursing note and vitals reviewed. Constitutional: She is oriented to person, place, and time. She appears well-developed and well-nourished. No distress.  HENT:  Head: Normocephalic and atraumatic.  Eyes: Conjunctivae normal and EOM are normal. Pupils are equal, round, and reactive to light. No scleral icterus.  Neck: Normal range of motion.  Cardiovascular: Normal rate, regular rhythm and normal heart sounds.  Exam reveals no gallop and no friction rub.   No murmur heard. Pulmonary/Chest: Effort normal and breath sounds normal. No respiratory distress.  Abdominal: Soft. Guarding present. Quiet abdomen. Exquisite tenderness to palpation in the left lower, left upper and right upper quadrants. No right lower quadrant abdominal pain. No CVA tenderness  Neurological: She is alert and oriented to person, place, and time.  Skin: Skin is warm and dry. She is not diaphoretic.     ED Course  Procedures (including critical care time) Labs Review Labs Reviewed  CBC WITH DIFFERENTIAL - Abnormal; Notable for the following:    WBC 16.5 (*)    Neutro Abs 11.5 (*)    Monocytes Absolute 1.4 (*)    All other components within normal limits  COMPREHENSIVE METABOLIC PANEL - Abnormal; Notable for the following:    Sodium 132 (*)    Chloride 94 (*)    Glucose, Bld 107 (*)    Total Protein  9.2 (*)    Anion gap 16 (*)    All other components within normal limits  LIPASE, BLOOD - Abnormal; Notable for the following:    Lipase 10 (*)    All other components within normal limits  CBG MONITORING, ED - Abnormal; Notable for the following:    Glucose-Capillary 105 (*)    All other components within normal limits  URINALYSIS, ROUTINE W REFLEX MICROSCOPIC  POC URINE PREG, ED    Imaging Review Dg Abd Acute W/chest  04/23/2014   CLINICAL DATA:  Abdominal pain  EXAM: ACUTE ABDOMEN SERIES (ABDOMEN 2 VIEW & CHEST 1 VIEW)   COMPARISON:  CT abdomen / pelvis 06/06/2013  FINDINGS: The bowel gas pattern is unremarkable and nonobstructed. A surgical staple line is present in the left mid abdomen. Correlation with prior CT imaging demonstrates the staple line corresponds with a distal pancreatectomy and splenectomy. Punctate nephrolithiasis overlying the lower pole of the right renal shadow. Vascular phleboliths project over the anatomic pelvis.  IMPRESSION: 1. Punctate right lower pole nephrolithiasis as seen on prior CT imaging. 2. No evidence of obstruction or other acute abnormality.   Electronically Signed   By: Jacqulynn Cadet M.D.   On: 04/23/2014 09:58     EKG Interpretation None      MDM   Final diagnoses:  None    10:17 AM BP 116/77  Pulse 86  Temp(Src) 98.1 F (36.7 C) (Oral)  Resp 15  SpO2 100%  LMP 04/11/2014 Patient here with complaint of abdominal pain. Acute on chronic in nature. She is also complaining of some diarrhea. She has no known contacts with similar symptoms, no recent foreign travel or ingestion of suspicious foods. The patient is exquisitely tender to palpation with guarding. Initial labs show leukocytosis with a left shift. Elevated glucose, CMP shows an anion gap of 16. Currently awaiting urine and urine pregnancy. Acute abdominal films does not show any signs of small bowel obstruction. Pain control initiated. We'll obtain a CT scan of the abdomen    2:38 PM BP 121/71  Pulse 94  Temp(Src) 98.1 F (36.7 C) (Oral)  Resp 20  SpO2 98%  LMP 04/11/2014 Pelvic exam: VULVA: normal appearing vulva with no masses, tenderness or lesions, VAGINA: normal appearing vagina with normal color and discharge, no lesions, CERVIX: normal appearing cervix without discharge or lesions, blood from OS. UTERUS: uterus is normal size, shape, consistency and nontender, ADNEXA: tenderness right. Vaginal [place] ordered.   3:22 PM BP 121/87  Pulse 93  Temp(Src) 98.1 F (36.7 C) (Oral)  Resp 11  SpO2  100%  LMP 04/11/2014 Patient urine appears to have infection. I have ordered  Rocephin. Pain meds. Mild hyponatremia. [place] pending   BP 129/57  Pulse 103  Temp(Src) 98.1 F (36.7 C) (Oral)  Resp 18  SpO2 100%  LMP 04/11/2014  Patient US shows ruptured ovarian cyst. She is hemodynamically stable.  D/c with pain meds and ob f/u . rpeat [place] in one month.   Margarita Mail, PA-C 04/23/14 1906

## 2014-04-23 NOTE — Discharge Instructions (Signed)
You will need a repeat US in about 1 month. Follow closely with [facility] or call your insurance company and find a local provider who takes your insurance. Please take all of your antibiotics until finished! This is for your urinary tract infection.  You may develop abdominal discomfort or diarrhea from the antibiotic.  You may help offset this with probiotics which you can buy or get in yogurt. Do not eat  or take the probiotics until 2 hours after your antibiotic.    Ovarian Cyst An ovarian cyst is a fluid-filled sac that forms on an ovary. The ovaries are small organs that produce eggs in women. Various types of cysts can form on the ovaries. Most are not cancerous. Many do not cause problems, and they often go away on their own. Some may cause symptoms and require treatment. Common types of ovarian cysts include:  Functional cysts--These cysts may occur every month during the menstrual cycle. This is normal. The cysts usually go away with the next menstrual cycle if the woman does not get pregnant. Usually, there are no symptoms with a functional cyst.  Endometrioma cysts--These cysts form from the tissue that lines the uterus. They are also called "chocolate cysts" because they become filled with blood that turns brown. This type of cyst can cause pain in the lower abdomen during intercourse and with your menstrual period.  Cystadenoma cysts--This type develops from the cells on the outside of the ovary. These cysts can get very big and cause lower abdomen pain and pain with intercourse. This type of cyst can twist on itself, cut off its blood supply, and cause severe pain. It can also easily rupture and cause a lot of pain.  Dermoid cysts--This type of cyst is sometimes found in both ovaries. These cysts may contain different kinds of body tissue, such as skin, teeth, hair, or cartilage. They usually do not cause symptoms unless they get very big.  Theca lutein cysts--These cysts occur when too much  of a certain hormone (human chorionic gonadotropin) is produced and overstimulates the ovaries to produce an egg. This is most common after procedures used to assist with the conception of a baby (in vitro fertilization). CAUSES   Fertility drugs can cause a condition in which multiple large cysts are formed on the ovaries. This is called ovarian hyperstimulation syndrome.  A condition called polycystic ovary syndrome can cause hormonal imbalances that can lead to nonfunctional ovarian cysts. SIGNS AND SYMPTOMS  Many ovarian cysts do not cause symptoms. If symptoms are present, they may include:  Pelvic pain or pressure.  Pain in the lower abdomen.  Pain during sexual intercourse.  Increasing girth (swelling) of the abdomen.  Abnormal menstrual periods.  Increasing pain with menstrual periods.  Stopping having menstrual periods without being pregnant. DIAGNOSIS  These cysts are commonly found during a routine or annual pelvic exam. Tests may be ordered to find out more about the cyst. These tests may include:  Ultrasound.  X-ray of the pelvis.  CT scan.  MRI.  Blood tests. TREATMENT  Many ovarian cysts go away on their own without treatment. Your health care provider may want to check your cyst regularly for 2-3 months to see if it changes. For women in menopause, it is particularly important to monitor a cyst closely because of the higher rate of ovarian cancer in menopausal women. When treatment is needed, it may include any of the following:  A procedure to drain the cyst (aspiration). This may be  done using a long needle and ultrasound. It can also be done through a laparoscopic procedure. This involves using a thin, lighted tube with a tiny camera on the end (laparoscope) inserted through a small incision.  Surgery to remove the whole cyst. This may be done using laparoscopic surgery or an open surgery involving a larger incision in the lower abdomen.  Hormone treatment  or birth control pills. These methods are sometimes used to help dissolve a cyst. HOME CARE INSTRUCTIONS   Only take over-the-counter or prescription medicines as directed by your health care provider.  Follow up with your health care provider as directed.  Get regular pelvic exams and Pap tests. SEEK MEDICAL CARE IF:   Your periods are late, irregular, or painful, or they stop.  Your pelvic pain or abdominal pain does not go away.  Your abdomen becomes larger or swollen.  You have pressure on your bladder or trouble emptying your bladder completely.  You have pain during sexual intercourse.  You have feelings of fullness, pressure, or discomfort in your stomach.  You lose weight for no apparent reason.  You feel generally ill.  You become constipated.  You lose your appetite.  You develop acne.  You have an increase in body and facial hair.  You are gaining weight, without changing your exercise and eating habits.  You think you are pregnant. SEEK IMMEDIATE MEDICAL CARE IF:   You have increasing abdominal pain.  You feel sick to your stomach (nauseous), and you throw up (vomit).  You develop a fever that comes on suddenly.  You have abdominal pain during a bowel movement.  Your menstrual periods become heavier than usual. MAKE SURE YOU:  Understand these instructions.  Will watch your condition.  Will get help right away if you are not doing well or get worse. Document Released: 09/09/2005 Document Revised: 09/14/2013 Document Reviewed: 05/17/2013 [facility] Patient Information 2015 [place], [place]. This information is not intended to replace advice given to you by your health care provider. Make sure you discuss any questions you have with your health care provider. Acetaminophen; Oxycodone tablets What is this medicine? ACETAMINOPHEN; OXYCODONE (a set a MEE noe fen; ox i KOE done) is a pain reliever. It is used to treat mild to moderate pain. This medicine  may be used for other purposes; ask your health care provider or pharmacist if you have questions. COMMON BRAND NAME(S): Endocet, Magnacet, Narvox, Percocet, Perloxx, Primalev, Primlev, Roxicet, Xolox What should I tell my health care provider before I take this medicine? They need to know if you have any of these conditions: -brain tumor -Crohn's disease, inflammatory bowel disease, or ulcerative colitis -drug abuse or addiction -head injury -heart or circulation problems -if you often drink alcohol -kidney disease or problems going to the bathroom -liver disease -lung disease, asthma, or breathing problems -an unusual or allergic reaction to acetaminophen, oxycodone, other opioid analgesics, other medicines, foods, dyes, or preservatives -pregnant or trying to get pregnant -breast-feeding How should I use this medicine? Take this medicine by mouth with a full glass of water. Follow the directions on the prescription label. Take your medicine at regular intervals. Do not take your medicine more often than directed. Talk to your pediatrician regarding the use of this medicine in children. Special care may be needed. Patients over 21 years old may have a stronger reaction and need a smaller dose. Overdosage: If you think you have taken too much of this medicine contact a poison control center or  emergency room at once. NOTE: This medicine is only for you. Do not share this medicine with others. What if I miss a dose? If you miss a dose, take it as soon as you can. If it is almost time for your next dose, take only that dose. Do not take double or extra doses. What may interact with this medicine? -alcohol -antihistamines -barbiturates like amobarbital, butalbital, butabarbital, methohexital, pentobarbital, phenobarbital, thiopental, and secobarbital -benztropine -drugs for bladder problems like solifenacin, trospium, oxybutynin, tolterodine, hyoscyamine, and methscopolamine -drugs for  breathing problems like ipratropium and tiotropium -drugs for certain stomach or intestine problems like propantheline, homatropine methylbromide, glycopyrrolate, atropine, belladonna, and dicyclomine -general anesthetics like etomidate, ketamine, nitrous oxide, propofol, desflurane, enflurane, halothane, isoflurane, and sevoflurane -medicines for depression, anxiety, or psychotic disturbances -medicines for sleep -muscle relaxants -naltrexone -narcotic medicines (opiates) for pain -phenothiazines like perphenazine, thioridazine, chlorpromazine, mesoridazine, fluphenazine, prochlorperazine, promazine, and trifluoperazine -scopolamine -tramadol -trihexyphenidyl This list may not describe all possible interactions. Give your health care provider a list of all the medicines, herbs, non-prescription drugs, or dietary supplements you use. Also tell them if you smoke, drink alcohol, or use illegal drugs. Some items may interact with your medicine. What should I watch for while using this medicine? Tell your doctor or health care professional if your pain does not go away, if it gets worse, or if you have new or a different type of pain. You may develop tolerance to the medicine. Tolerance means that you will need a higher dose of the medication for pain relief. Tolerance is normal and is expected if you take this medicine for a long time. Do not suddenly stop taking your medicine because you may develop a severe reaction. Your body becomes used to the medicine. This does NOT mean you are addicted. Addiction is a behavior related to getting and using a drug for a non-medical reason. If you have pain, you have a medical reason to take pain medicine. Your doctor will tell you how much medicine to take. If your doctor wants you to stop the medicine, the dose will be slowly lowered over time to avoid any side effects. You may get drowsy or dizzy. Do not drive, use machinery, or do anything that needs mental  alertness until you know how this medicine affects you. Do not stand or sit up quickly, especially if you are an older patient. This reduces the risk of dizzy or fainting spells. Alcohol may interfere with the effect of this medicine. Avoid alcoholic drinks. There are different types of narcotic medicines (opiates) for pain. If you take more than one type at the same time, you may have more side effects. Give your health care provider a list of all medicines you use. Your doctor will tell you how much medicine to take. Do not take more medicine than directed. Call emergency for help if you have problems breathing. The medicine will cause constipation. Try to have a bowel movement at least every 2 to 3 days. If you do not have a bowel movement for 3 days, call your doctor or health care professional. Do not take Tylenol (acetaminophen) or medicines that have acetaminophen with this medicine. Too much acetaminophen can be very dangerous. Many nonprescription medicines contain acetaminophen. Always read the labels carefully to avoid taking more acetaminophen. What side effects may I notice from receiving this medicine? Side effects that you should report to your doctor or health care professional as soon as possible: -allergic reactions like skin rash, itching or  hives, swelling of the face, lips, or tongue -breathing difficulties, wheezing -confusion -light headedness or fainting spells -severe stomach pain -unusually weak or tired -yellowing of the skin or the whites of the eyes Side effects that usually do not require medical attention (report to your doctor or health care professional if they continue or are bothersome): -dizziness -drowsiness -nausea -vomiting This list may not describe all possible side effects. Call your doctor for medical advice about side effects. You may report side effects to FDA at 1-800-FDA-1088. Where should I keep my medicine? Keep out of the reach of children. This  medicine can be abused. Keep your medicine in a safe place to protect it from theft. Do not share this medicine with anyone. Selling or giving away this medicine is dangerous and against the law. Store at room temperature between 20 and 25 degrees C (68 and 77 degrees F). Keep container tightly closed. Protect from light. This medicine may cause accidental overdose and death if it is taken by other adults, children, or pets. Flush any unused medicine down the toilet to reduce the chance of harm. Do not use the medicine after the expiration date. NOTE: This sheet is a summary. It may not cover all possible information. If you have questions about this medicine, talk to your doctor, pharmacist, or health care provider.  2015, [employer]. (2013-05-03 13:17:35)

## 2014-04-23 NOTE — ED Notes (Signed)
Patient transported to Ultrasound 

## 2014-04-23 NOTE — ED Notes (Signed)
Patient transported to CT 

## 2014-04-23 NOTE — ED Notes (Signed)
Patient transported to X-ray 

## 2014-04-23 NOTE — ED Notes (Signed)
Pt returned from scanner; back on monitor.

## 2014-04-23 NOTE — ED Notes (Signed)
Pt. Stated, I've had abdominal pain for 4 days.

## 2014-04-23 NOTE — ED Notes (Signed)
Pt remains in scanner 

## 2014-04-24 NOTE — ED Provider Notes (Signed)
Medical screening examination/treatment/procedure(s) were performed by non-physician practitioner and as supervising physician I was immediately available for consultation/collaboration.   EKG Interpretation None       Orlie Dakin, MD 04/24/14 0700

## 2014-04-25 LAB — GC/CHLAMYDIA PROBE AMP
CT PROBE, AMP APTIMA: POSITIVE — AB
GC PROBE AMP APTIMA: NEGATIVE

## 2014-04-26 ENCOUNTER — Telehealth (HOSPITAL_BASED_OUTPATIENT_CLINIC_OR_DEPARTMENT_OTHER): Payer: Self-pay | Admitting: Emergency Medicine

## 2014-04-26 NOTE — Telephone Encounter (Signed)
+  Chlamydia. Chart sent to [place] office for review. [facility]HS attached.

## 2014-04-26 NOTE — Telephone Encounter (Signed)
Chart returned from [place] office. Per Irena Cords PA-C, give Zithromax 1 gram PO once.

## 2014-09-28 ENCOUNTER — Emergency Department (HOSPITAL_COMMUNITY)
Admission: EM | Admit: 2014-09-28 | Discharge: 2014-09-28 | Discharge status: Against Medical Advice | Attending: Emergency Medicine | Admitting: Emergency Medicine

## 2014-09-28 ENCOUNTER — Encounter (HOSPITAL_COMMUNITY): Payer: Self-pay | Admitting: Emergency Medicine

## 2014-09-28 DIAGNOSIS — R1110 Vomiting, unspecified: Secondary | ICD-10-CM | POA: Insufficient documentation

## 2014-09-28 DIAGNOSIS — Z720 Tobacco use: Secondary | ICD-10-CM | POA: Insufficient documentation

## 2014-09-28 DIAGNOSIS — R52 Pain, unspecified: Secondary | ICD-10-CM | POA: Insufficient documentation

## 2014-09-28 DIAGNOSIS — R197 Diarrhea, unspecified: Secondary | ICD-10-CM | POA: Insufficient documentation

## 2014-09-28 DIAGNOSIS — R0981 Nasal congestion: Secondary | ICD-10-CM | POA: Insufficient documentation

## 2014-09-28 LAB — CBC WITH DIFFERENTIAL/PLATELET
BASOS ABS: 0.1 10*3/uL (ref 0.0–0.1)
BASOS PCT: 1 % (ref 0–1)
EOS PCT: 1 % (ref 0–5)
Eosinophils Absolute: 0.1 10*3/uL (ref 0.0–0.7)
HCT: 39.9 % (ref 36.0–46.0)
HEMOGLOBIN: 13 g/dL (ref 12.0–15.0)
Lymphocytes Relative: 49 % — ABNORMAL HIGH (ref 12–46)
Lymphs Abs: 3.8 10*3/uL (ref 0.7–4.0)
MCH: 27.3 pg (ref 26.0–34.0)
MCHC: 32.6 g/dL (ref 30.0–36.0)
MCV: 83.6 fL (ref 78.0–100.0)
MONO ABS: 1.1 10*3/uL — AB (ref 0.1–1.0)
MONOS PCT: 14 % — AB (ref 3–12)
NEUTROS ABS: 2.7 10*3/uL (ref 1.7–7.7)
Neutrophils Relative %: 35 % — ABNORMAL LOW (ref 43–77)
PLATELETS: 343 10*3/uL (ref 150–400)
RBC: 4.77 MIL/uL (ref 3.87–5.11)
RDW: 15.7 % — ABNORMAL HIGH (ref 11.5–15.5)
WBC: 7.6 10*3/uL (ref 4.0–10.5)

## 2014-09-28 LAB — COMPREHENSIVE METABOLIC PANEL
ALBUMIN: 4.6 g/dL (ref 3.5–5.2)
ALK PHOS: 66 U/L (ref 39–117)
ALT: 13 U/L (ref 0–35)
AST: 22 U/L (ref 0–37)
Anion gap: 11 (ref 5–15)
BILIRUBIN TOTAL: 0.6 mg/dL (ref 0.3–1.2)
BUN: 12 mg/dL (ref 6–23)
CHLORIDE: 101 meq/L (ref 96–112)
CO2: 26 mmol/L (ref 19–32)
Calcium: 9.3 mg/dL (ref 8.4–10.5)
Creatinine, Ser: 0.97 mg/dL (ref 0.50–1.10)
GFR calc Af Amer: >90 mL/min (ref >90)
GFR calc non Af Amer: 79 mL/min — ABNORMAL LOW (ref >90)
GLUCOSE: 107 mg/dL — AB (ref 70–99)
Potassium: 2.9 mmol/L — ABNORMAL LOW (ref 3.5–5.1)
Sodium: 138 mmol/L (ref 135–145)
Total Protein: 8.4 g/dL — ABNORMAL HIGH (ref 6.0–8.3)

## 2014-09-28 LAB — LIPASE, BLOOD: Lipase: 18 U/L (ref 11–59)

## 2014-09-28 NOTE — ED Notes (Addendum)
Pt c/o runny nose, congestion, n/v/d and body aches x 2 days, states its getting worse.

## 2014-10-31 ENCOUNTER — Encounter (HOSPITAL_COMMUNITY): Payer: Self-pay | Admitting: Emergency Medicine

## 2014-10-31 ENCOUNTER — Inpatient Hospital Stay (HOSPITAL_COMMUNITY): Payer: Non-veteran care

## 2014-10-31 ENCOUNTER — Inpatient Hospital Stay (HOSPITAL_COMMUNITY)
Admission: EM | Admit: 2014-10-31 | Discharge: 2014-11-04 | DRG: 299 | Disposition: A | Discharge status: Home / Self Care | Payer: Non-veteran care | Attending: Internal Medicine | Admitting: Internal Medicine

## 2014-10-31 ENCOUNTER — Emergency Department (HOSPITAL_COMMUNITY): Payer: Non-veteran care

## 2014-10-31 DIAGNOSIS — I808 Phlebitis and thrombophlebitis of other sites: Principal | ICD-10-CM | POA: Diagnosis present

## 2014-10-31 DIAGNOSIS — Z8719 Personal history of other diseases of the digestive system: Secondary | ICD-10-CM | POA: Insufficient documentation

## 2014-10-31 DIAGNOSIS — K869 Disease of pancreas, unspecified: Secondary | ICD-10-CM | POA: Diagnosis present

## 2014-10-31 DIAGNOSIS — Z9081 Acquired absence of spleen: Secondary | ICD-10-CM | POA: Diagnosis present

## 2014-10-31 DIAGNOSIS — G935 Compression of brain: Secondary | ICD-10-CM | POA: Diagnosis present

## 2014-10-31 DIAGNOSIS — J029 Acute pharyngitis, unspecified: Secondary | ICD-10-CM | POA: Diagnosis present

## 2014-10-31 DIAGNOSIS — I82C11 Acute embolism and thrombosis of right internal jugular vein: Secondary | ICD-10-CM | POA: Diagnosis present

## 2014-10-31 DIAGNOSIS — T508X5A Adverse effect of diagnostic agents, initial encounter: Secondary | ICD-10-CM | POA: Diagnosis not present

## 2014-10-31 DIAGNOSIS — Z91018 Allergy to other foods: Secondary | ICD-10-CM

## 2014-10-31 DIAGNOSIS — K219 Gastro-esophageal reflux disease without esophagitis: Secondary | ICD-10-CM | POA: Diagnosis present

## 2014-10-31 DIAGNOSIS — A491 Streptococcal infection, unspecified site: Secondary | ICD-10-CM | POA: Diagnosis present

## 2014-10-31 DIAGNOSIS — J45909 Unspecified asthma, uncomplicated: Secondary | ICD-10-CM | POA: Diagnosis present

## 2014-10-31 DIAGNOSIS — Z3041 Encounter for surveillance of contraceptive pills: Secondary | ICD-10-CM | POA: Insufficient documentation

## 2014-10-31 DIAGNOSIS — R591 Generalized enlarged lymph nodes: Secondary | ICD-10-CM | POA: Diagnosis present

## 2014-10-31 DIAGNOSIS — R748 Abnormal levels of other serum enzymes: Secondary | ICD-10-CM | POA: Diagnosis present

## 2014-10-31 DIAGNOSIS — Z885 Allergy status to narcotic agent status: Secondary | ICD-10-CM

## 2014-10-31 DIAGNOSIS — F17210 Nicotine dependence, cigarettes, uncomplicated: Secondary | ICD-10-CM | POA: Diagnosis present

## 2014-10-31 DIAGNOSIS — Z720 Tobacco use: Secondary | ICD-10-CM | POA: Diagnosis present

## 2014-10-31 DIAGNOSIS — N83201 Unspecified ovarian cyst, right side: Secondary | ICD-10-CM | POA: Insufficient documentation

## 2014-10-31 DIAGNOSIS — R0789 Other chest pain: Secondary | ICD-10-CM

## 2014-10-31 DIAGNOSIS — R9389 Abnormal findings on diagnostic imaging of other specified body structures: Secondary | ICD-10-CM

## 2014-10-31 DIAGNOSIS — D649 Anemia, unspecified: Secondary | ICD-10-CM | POA: Diagnosis present

## 2014-10-31 DIAGNOSIS — Z90411 Acquired partial absence of pancreas: Secondary | ICD-10-CM | POA: Diagnosis present

## 2014-10-31 DIAGNOSIS — N8320 Unspecified ovarian cysts: Secondary | ICD-10-CM | POA: Diagnosis present

## 2014-10-31 DIAGNOSIS — T886XXA Anaphylactic reaction due to adverse effect of correct drug or medicament properly administered, initial encounter: Secondary | ICD-10-CM | POA: Diagnosis not present

## 2014-10-31 DIAGNOSIS — L500 Allergic urticaria: Secondary | ICD-10-CM

## 2014-10-31 DIAGNOSIS — Z9049 Acquired absence of other specified parts of digestive tract: Secondary | ICD-10-CM | POA: Diagnosis present

## 2014-10-31 DIAGNOSIS — B954 Other streptococcus as the cause of diseases classified elsewhere: Secondary | ICD-10-CM | POA: Diagnosis present

## 2014-10-31 DIAGNOSIS — E876 Hypokalemia: Secondary | ICD-10-CM | POA: Diagnosis present

## 2014-10-31 DIAGNOSIS — L509 Urticaria, unspecified: Secondary | ICD-10-CM | POA: Diagnosis not present

## 2014-10-31 DIAGNOSIS — I493 Ventricular premature depolarization: Secondary | ICD-10-CM | POA: Diagnosis present

## 2014-10-31 DIAGNOSIS — R197 Diarrhea, unspecified: Secondary | ICD-10-CM | POA: Diagnosis not present

## 2014-10-31 LAB — BASIC METABOLIC PANEL
Anion gap: 10 (ref 5–15)
BUN: 8 mg/dL (ref 6–23)
CO2: 21 mmol/L (ref 19–32)
Calcium: 8.4 mg/dL (ref 8.4–10.5)
Chloride: 101 mmol/L (ref 96–112)
Creatinine, Ser: 0.86 mg/dL (ref 0.50–1.10)
GFR calc Af Amer: >90 mL/min (ref >90)
GFR calc non Af Amer: >90 mL/min (ref >90)
Glucose, Bld: 123 mg/dL — ABNORMAL HIGH (ref 70–99)
POTASSIUM: 3.3 mmol/L — AB (ref 3.5–5.1)
Sodium: 132 mmol/L — ABNORMAL LOW (ref 135–145)

## 2014-10-31 LAB — CBC WITH DIFFERENTIAL/PLATELET
Basophils Absolute: 0 10*3/uL (ref 0.0–0.1)
Basophils Relative: 0 % (ref 0–1)
EOS ABS: 0 10*3/uL (ref 0.0–0.7)
EOS PCT: 0 % (ref 0–5)
HCT: 51.6 % — ABNORMAL HIGH (ref 36.0–46.0)
Hemoglobin: 18.3 g/dL — ABNORMAL HIGH (ref 12.0–15.0)
LYMPHS ABS: 0.5 10*3/uL — AB (ref 0.7–4.0)
Lymphocytes Relative: 5 % — ABNORMAL LOW (ref 12–46)
MCH: 29.1 pg (ref 26.0–34.0)
MCHC: 35.5 g/dL (ref 30.0–36.0)
MCV: 82 fL (ref 78.0–100.0)
MONOS PCT: 3 % (ref 3–12)
Monocytes Absolute: 0.3 10*3/uL (ref 0.1–1.0)
Neutro Abs: 9.6 10*3/uL — ABNORMAL HIGH (ref 1.7–7.7)
Neutrophils Relative %: 92 % — ABNORMAL HIGH (ref 43–77)
Platelets: 102 10*3/uL — ABNORMAL LOW (ref 150–400)
RBC: 6.29 MIL/uL — AB (ref 3.87–5.11)
RDW: 15 % (ref 11.5–15.5)
WBC: 10.5 10*3/uL (ref 4.0–10.5)

## 2014-10-31 LAB — I-STAT CG4 LACTIC ACID, ED
LACTIC ACID, VENOUS: 0.63 mmol/L (ref 0.5–2.0)
Lactic Acid, Venous: 0.91 mmol/L (ref 0.5–2.0)

## 2014-10-31 LAB — RAPID STREP SCREEN (MED CTR MEBANE ONLY): Streptococcus, Group A Screen (Direct): NEGATIVE

## 2014-10-31 MED ORDER — EPINEPHRINE 0.3 MG/0.3ML IJ SOAJ
0.3000 mg | Freq: Once | INTRAMUSCULAR | Status: AC
  Administered 2014-10-31: 0.3 mg via INTRAMUSCULAR
  Filled 2014-10-31: qty 0.3

## 2014-10-31 MED ORDER — SODIUM CHLORIDE 0.9 % IV SOLN
3.0000 g | Freq: Four times a day (QID) | INTRAVENOUS | Status: DC
  Administered 2014-10-31 – 2014-11-04 (×15): 3 g via INTRAVENOUS
  Filled 2014-10-31 (×21): qty 3

## 2014-10-31 MED ORDER — SODIUM CHLORIDE 0.9 % IV SOLN
INTRAVENOUS | Status: DC
  Administered 2014-10-31: 16:00:00 via INTRAVENOUS

## 2014-10-31 MED ORDER — KETOROLAC TROMETHAMINE 30 MG/ML IJ SOLN
30.0000 mg | Freq: Once | INTRAMUSCULAR | Status: AC
  Administered 2014-10-31: 30 mg via INTRAVENOUS
  Filled 2014-10-31: qty 1

## 2014-10-31 MED ORDER — KETOROLAC TROMETHAMINE 15 MG/ML IJ SOLN
15.0000 mg | Freq: Four times a day (QID) | INTRAMUSCULAR | Status: DC | PRN
  Administered 2014-10-31: 15 mg via INTRAVENOUS
  Filled 2014-10-31 (×2): qty 1

## 2014-10-31 MED ORDER — SODIUM CHLORIDE 0.9 % IV BOLUS (SEPSIS)
1000.0000 mL | Freq: Once | INTRAVENOUS | Status: AC
  Administered 2014-10-31: 1000 mL via INTRAVENOUS

## 2014-10-31 MED ORDER — ACETAMINOPHEN 500 MG PO TABS
500.0000 mg | ORAL_TABLET | Freq: Four times a day (QID) | ORAL | Status: DC | PRN
  Administered 2014-11-01 – 2014-11-02 (×2): 500 mg via ORAL
  Filled 2014-10-31 (×2): qty 1

## 2014-10-31 MED ORDER — ACETAMINOPHEN 325 MG PO TABS
650.0000 mg | ORAL_TABLET | Freq: Once | ORAL | Status: AC
  Administered 2014-10-31: 650 mg via ORAL
  Filled 2014-10-31: qty 2

## 2014-10-31 MED ORDER — DIPHENHYDRAMINE HCL 50 MG/ML IJ SOLN: 12.5000 mg | Freq: Four times a day (QID) | INTRAMUSCULAR | Status: DC | PRN

## 2014-10-31 MED ORDER — CLINDAMYCIN PHOSPHATE 300 MG/50ML IV SOLN
300.0000 mg | Freq: Three times a day (TID) | INTRAVENOUS | Status: DC
  Filled 2014-10-31 (×2): qty 50

## 2014-10-31 MED ORDER — POTASSIUM CHLORIDE CRYS ER 20 MEQ PO TBCR
40.0000 meq | EXTENDED_RELEASE_TABLET | Freq: Once | ORAL | Status: AC
  Administered 2014-10-31: 40 meq via ORAL
  Filled 2014-10-31: qty 2

## 2014-10-31 MED ORDER — KETOROLAC TROMETHAMINE 15 MG/ML IJ SOLN
15.0000 mg | Freq: Once | INTRAMUSCULAR | Status: AC
  Administered 2014-10-31: 15 mg via INTRAVENOUS
  Filled 2014-10-31: qty 1

## 2014-10-31 MED ORDER — PIPERACILLIN-TAZOBACTAM 3.375 G IVPB 30 MIN
3.3750 g | Freq: Once | INTRAVENOUS | Status: AC
  Administered 2014-10-31: 3.375 g via INTRAVENOUS
  Filled 2014-10-31: qty 50

## 2014-10-31 MED ORDER — ENOXAPARIN SODIUM 40 MG/0.4ML ~~LOC~~ SOLN
40.0000 mg | SUBCUTANEOUS | Status: DC
  Administered 2014-10-31: 40 mg via SUBCUTANEOUS
  Filled 2014-10-31 (×2): qty 0.4

## 2014-10-31 MED ORDER — PIPERACILLIN-TAZOBACTAM 3.375 G IVPB 30 MIN: 3.3750 g | Freq: Once | INTRAVENOUS | Status: DC

## 2014-10-31 MED ORDER — PIPERACILLIN-TAZOBACTAM 3.375 G IVPB: 3.3750 g | Freq: Three times a day (TID) | INTRAVENOUS | Status: DC

## 2014-10-31 MED ORDER — FENTANYL CITRATE 0.05 MG/ML IJ SOLN
50.0000 ug | Freq: Once | INTRAMUSCULAR | Status: AC
  Administered 2014-10-31: 50 ug via INTRAVENOUS
  Filled 2014-10-31: qty 2

## 2014-10-31 MED ORDER — ENOXAPARIN SODIUM 40 MG/0.4ML ~~LOC~~ SOLN
40.0000 mg | SUBCUTANEOUS | Status: DC
Start: 2014-10-31 — End: 2014-10-31
  Filled 2014-10-31: qty 0.4

## 2014-10-31 MED ORDER — CLINDAMYCIN PHOSPHATE 600 MG/50ML IV SOLN
600.0000 mg | Freq: Once | INTRAVENOUS | Status: DC
  Filled 2014-10-31: qty 50

## 2014-10-31 MED ORDER — METHYLPREDNISOLONE SODIUM SUCC 125 MG IJ SOLR
125.0000 mg | Freq: Once | INTRAMUSCULAR | Status: AC
  Administered 2014-10-31: 125 mg via INTRAVENOUS
  Filled 2014-10-31: qty 2

## 2014-10-31 MED ORDER — IOHEXOL 300 MG/ML  SOLN
75.0000 mL | Freq: Once | INTRAMUSCULAR | Status: AC | PRN
Start: 2014-10-31 — End: 2014-10-31
  Administered 2014-10-31: 75 mL via INTRAVENOUS

## 2014-10-31 MED ORDER — DIPHENHYDRAMINE HCL 50 MG/ML IJ SOLN
50.0000 mg | Freq: Once | INTRAMUSCULAR | Status: AC
  Administered 2014-10-31: 50 mg via INTRAVENOUS
  Filled 2014-10-31: qty 1

## 2014-10-31 MED ORDER — IBUPROFEN 800 MG PO TABS
800.0000 mg | ORAL_TABLET | Freq: Once | ORAL | Status: AC
  Administered 2014-10-31: 800 mg via ORAL
  Filled 2014-10-31 (×2): qty 1

## 2014-10-31 MED ORDER — CLINDAMYCIN PHOSPHATE 300 MG/50ML IV SOLN: 300.0000 mg | Freq: Three times a day (TID) | INTRAVENOUS | Status: DC

## 2014-10-31 NOTE — ED Notes (Signed)
Pt asking for more water; asked Dreama, RN and she ok'd for pt to have water

## 2014-10-31 NOTE — ED Notes (Signed)
Patient reports having a sore throat, with a mild cough. Complains of having a "knot" on the right side of neck. Received flu shot this year.

## 2014-10-31 NOTE — ED Provider Notes (Signed)
CSN: [id number]     Arrival date & time 10/31/14  0527 History   First MD Initiated Contact with Patient 10/31/14 [phone]     Chief Complaint  Patient presents with  . 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 Patient states since Saturday she has had a sore throat and felt like there is "a knot on the outside of my neck". She has tried cough drops and warm compresses without relief. She rates her discomfort as a 9 out of 10. She reports difficulty swallowing but denies fevers, difficulties breathing, drooling, cough. Denies headache, vision changes, chest pain, shortness of breath, nausea or vomiting, abdominal pain, diarrhea or constipation, numbness or weakness, syncope, rash, urinary symptoms.  Past Medical History  Diagnosis Date  . Pancreatic tumor   . GERD (gastroesophageal reflux disease)   . Chronic abdominal pain    Past Surgical History  Procedure Laterality Date  . Splenectomy    . Cholecystectomy    . Pancreatectomy      partial   History reviewed. No pertinent family history. History  Substance Use Topics  . Smoking status: Current Every Day Smoker  . Smokeless tobacco: Not on file  . Alcohol Use: No   OB History    No data available     Review of Systems  Constitutional: Negative for fever.  HENT: Positive for sore throat. Negative for drooling and rhinorrhea.   Eyes: Negative for visual disturbance.  Respiratory: Negative for shortness of breath.   Cardiovascular: Negative for chest pain.  Gastrointestinal: Negative for abdominal pain.  Endocrine: Negative for polyuria.  Genitourinary: Negative for dysuria.  Skin: Negative for rash.  Neurological: Negative for headaches.      Allergies  Strawberry and Hydrocodone  Home Medications   Prior to Admission medications   Medication Sig  Start Date End Date Taking? Authorizing Provider  norethindrone-ethinyl estradiol-iron (MICROGESTIN FE,GILDESS FE,LOESTRIN FE) 1.5-30 MG-MCG tablet Take 1 tablet by mouth daily.   Yes Historical Provider, MD  cephALEXin (KEFLEX) 500 MG capsule Take 1 capsule (500 mg total) by mouth 4 (four) times daily. Patient not taking: Reported on 09/28/2014 04/23/14   Margarita Mail, PA-C  oxyCODONE-acetaminophen (PERCOCET/ROXICET) 5-325 MG per tablet Take 1-2 tablets by mouth every 4 (four) hours as needed for moderate pain or severe pain. Patient not taking: Reported on 09/28/2014 04/23/14   Margarita Mail, PA-C  Phenyleph-Doxylamine-DM-APAP (ALKA SELTZER PLUS PO) Take 1 each by mouth once.    Historical Provider, MD   BP 120/74 mmHg  Pulse 121  Temp(Src) 102.1 F (38.9 C) (Oral)  Resp 20  Ht 5\' 7"  (1.702 m)  Wt 143 lb (64.864 kg)  BMI 22.39 kg/m2  SpO2 98%  LMP 10/30/2014 Physical Exam  Constitutional: She is oriented to person, place, and time. She appears well-developed and well-nourished.  HENT:  Head: Normocephalic and atraumatic.  Mouth/Throat: Oropharynx is clear and moist.  Erythematous posterior oropharynx. Bilateral tonsillar exudate. No unilateral tonsillar swelling, no trismus, no glossal elevation. Tolerating secretions well, patent airway  Eyes: Conjunctivae are normal. Pupils are equal, round, and reactive to light. Right eye exhibits no discharge. Left eye exhibits no discharge. No scleral icterus.  Neck: Normal range of motion. Neck supple. No thyromegaly present.  Cardiovascular: Regular rhythm and normal heart sounds.   Tachycardic  Pulmonary/Chest: Effort normal and breath sounds normal. No respiratory distress. She has no wheezes.  She has no rales.  Abdominal: Soft. There is no tenderness.  Musculoskeletal: She exhibits no tenderness.  Lymphadenopathy:    She has cervical adenopathy.  Neurological: She is alert and oriented to person, place, and time.  Cranial Nerves II-XII  grossly intact  Skin: Skin is warm and dry. No rash noted.  Psychiatric: She has a normal mood and affect.  Nursing note and vitals reviewed.   ED Course  Procedures (including critical care time) Labs Review Labs Reviewed  BASIC METABOLIC PANEL - Abnormal; Notable for the following:    Sodium 132 (*)    Potassium 3.3 (*)    Glucose, Bld 123 (*)    All other components within normal limits  CBC WITH DIFFERENTIAL/PLATELET - Abnormal; Notable for the following:    RBC 6.29 (*)    Hemoglobin 18.3 (*)    HCT 51.6 (*)    Platelets 102 (*)    Neutrophils Relative % 92 (*)    Neutro Abs 9.6 (*)    Lymphocytes Relative 5 (*)    Lymphs Abs 0.5 (*)    All other components within normal limits  RAPID STREP SCREEN  CULTURE, GROUP A STREP  CULTURE, BLOOD (ROUTINE X 2)  CULTURE, BLOOD (ROUTINE X 2)  I-STAT CG4 LACTIC ACID, ED    Imaging Review Ct Soft Tissue Neck W Contrast  10/31/2014   CLINICAL DATA:  Sore throat, mild cough, and knot on the right side of neck - onset 10/29/2014  EXAM: CT NECK WITH CONTRAST  TECHNIQUE: Multidetector CT imaging of the neck was performed using the standard protocol following the bolus administration of intravenous contrast.  CONTRAST:  41mL OMNIPAQUE IOHEXOL 300 MG/ML  SOLN  COMPARISON:  None.  FINDINGS: Pharynx and larynx: There is fat edema surrounding the pharynx, with mild enlargement of the tonsils, asymmetric to the right (where there is also likely pus in the tonsillar crypts). Reactive cervical lymphadenopathy without suppurative change. In the region of maximal inflammation in the right neck, there is an eccentric, well-circumscribed contrast filling defect most consistent with early clot. This appearance is not typical for a mixing/flow artifact or valve. No retropharyngeal abscess or phlegmon.  Salivary glands: No primary inflammation  Thyroid: Negative  Lymph nodes: Reactive enlargement, as above.  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  Visualized orbits: Negative  Mastoids and visualized paranasal sinuses: Negative  Skeleton: Negative  Upper chest: A subsegmental bronchus in the left upper lobe is opacified, indistinct, and mildly expanded. No other bronchiectatic airways.  IMPRESSION: 1. Tonsillitis/pharyngitis with parapharyngeal edema and adenopathy. Small thrombus in the right IJ, neighboring the maximal inflammation. 2. 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 The appearance is not typical of septic embolus related to #1.   Electronically Signed   By: Jorje Guild M.D.   On: 10/31/2014 10:25     EKG Interpretation None     Meds given in ED:  Medications  sodium chloride 0.9 % bolus 1,000 mL (not administered)  clindamycin (CLEOCIN) IVPB 600 mg (not administered)  fentaNYL (SUBLIMAZE) injection 50 mcg (50 mcg Intravenous Given 10/31/14 0549)  sodium chloride 0.9 % bolus 1,000 mL (0 mLs Intravenous Stopped 10/31/14 0811)  sodium chloride 0.9 % bolus 1,000 mL (0 mLs Intravenous Stopped 10/31/14 1027)  acetaminophen (TYLENOL) tablet 650  mg (650 mg Oral Given 10/31/14 0932)  fentaNYL (SUBLIMAZE) injection 50 mcg (50 mcg Intravenous Given 10/31/14 0934)  iohexol (OMNIPAQUE) 300 MG/ML solution 75 mL (75 mLs Intravenous Contrast Given 10/31/14 0945)    New Prescriptions   No medications on file   Filed Vitals:   10/31/14 0935 10/31/14 1001 10/31/14 1015 10/31/14 1027  BP:  131/78 120/74   Pulse:  123 121   Temp: 104.4 F (40.2 C)   102.1 F (38.9 C)  TempSrc: Rectal   Oral  Resp:  20    Height:      Weight:      SpO2:  98% 98%     MDM  Patient febrile. Persistently tachycardic despite administration of 3rd saline bolus.   Pt resting comfortably in ED. Denies any other symptoms other than sore throat. PE--bilateral exudate of tonsillitis with no unilateral tonsillar swelling, glossal elevation Labwork-rapid strep negative, elevated neutrophils without overt leukocytosis. Imaging-CT shows tonsillitis. There is a small thrombus in the right IJ neighboring the maximal inflammation. There is also an opacified and possibly expanded left upper lobe subsegmental bronchus, chronic airway inflammation versus bronchopneumonia. The appearance is not typical of septic embolus related to #1. After administration of IV contrast, patient began to develop hives and 2 episodes of diarrhea. Treated for anaphylaxis.  Discussed patient presentation with attending, Dr. Tawnya Crook. Due to patient presentation, previously healthy status, new thrombus in IJ and related sore throat infection, will have patient admitted to medicine for evaluation of potential Lemierre's syndrome.  Started IV clindamycin/zosyn in the ED. Obtained blood cultures.  Consult IM, patient admitted.   Final diagnoses:  Pharyngitis        Verl Dicker, PA-C 11/01/14 [place], MD 11/03/14 [phone]

## 2014-10-31 NOTE — H&P (Signed)
Date: 10/31/2014               Patient Name:  [name] [name] MRN: [id number]  DOB: 09/06/1986 Age / Sex: 29 y.o., female   PCP: Pcp Not In System         Medical Service: Internal Medicine Teaching Service         Attending Physician: Dr. Bartholomew Crews, MD    First Contact: Primus Bravo, MS3 Dr. Karle Starch Moding Pager: [phone]  Second Contact: Dr. Juluis Mire Pager: [phone]       After Hours (After 5p/  First Contact Pager: [phone]  weekends / holidays): Second Contact Pager: [phone]   Chief Complaint: dysphagia - "I can barely swallow"  History of Present Illness: [name] [name] is a 29yo woman with PMHx of pancreatic tumor and GERD who presents with 2 days of dysphagia. She has equal difficulty swallowing solids and liquids due to pain. She reports right-sided submandibular tenderness and swelling w/ palpable mass. The pain sometimes radiates to right ear. She has maintained full neck range of motion, but prefers to rest with a pillow under her chin. Warm compresses have also given some relief. She reports fatigue and subjective fevers at home. She denies sore throat, recent illness,cough, runny nose, or sick contacts. Pt did receive flu shot this year. She came to ED in January 2016, but for runny nose, congestion, n/v/d, body aches; no sore throat at that time.   In the ED today, pt was given three 1L NS boluses, CT head & neck performed, and started clindamycin and Zosyn. Pt had mild allergic reaction to contrast, broke out in hives, and was treated with an Epi-pen, Solu-medrol, and benadryl.  Meds: Current Facility-Administered Medications  Medication Dose Route Frequency Provider Last Rate Last Dose  . 0.9 %  sodium chloride infusion   Intravenous Continuous Marjan Rabbani, MD      . clindamycin (CLEOCIN) IVPB 300 mg  300 mg Intravenous 3 times per day Juluis Mire, MD      . clindamycin (CLEOCIN) IVPB 600 mg  600 mg Intravenous Once Verl Dicker,  PA-C   Stopped at 10/31/14 1059  . diphenhydrAMINE (BENADRYL) injection 12.5 mg  12.5 mg Intravenous Q6H PRN Marjan Rabbani, MD      . enoxaparin (LOVENOX) injection 40 mg  40 mg Subcutaneous Q24H Marjan Rabbani, MD      . ketorolac (TORADOL) 15 MG/ML injection 15 mg  15 mg Intravenous Q6H PRN Marjan Rabbani, MD      . piperacillin-tazobactam (ZOSYN) IVPB 3.375 g  3.375 g Intravenous Q8H Bartholomew Crews, MD       Current Outpatient Prescriptions  Medication Sig Dispense Refill  . norethindrone-ethinyl estradiol-iron (MICROGESTIN FE,GILDESS FE,LOESTRIN FE) 1.5-30 MG-MCG tablet Take 1 tablet by mouth daily.    Marland Kitchen Phenyleph-Doxylamine-DM-APAP (ALKA SELTZER PLUS PO) Take 1 each by mouth once.      Allergies: Allergies as of 10/31/2014 - Review Complete 10/31/2014  Allergen Reaction Noted  . Contrast media [iodinated diagnostic agents] Hives 10/31/2014  . Strawberry Anaphylaxis 06/06/2013  . Hydrocodone Hives 06/06/2013   Past Medical History  Diagnosis Date  . Pancreatic tumor   . GERD (gastroesophageal reflux disease)   . Chronic abdominal pain    Past Surgical History  Procedure Laterality Date  . Splenectomy    . Cholecystectomy    . Pancreatectomy      partial   Family History  Problem Relation Age of Onset  . Hypertension Mother   .  Diabetes Mother   . Cancer Mother     Ovarian, Breast  . Hypertension Father   . Diabetes Father    History   Social History  . Marital Status: Single    Spouse Name: N/A    Number of Children: 0  . Years of Education: N/A   Occupational History  . [employer]     Finished 02/15.   Social History Main Topics  . Smoking status: Current Every Day Smoker -- 0.50 packs/day for 10 years    Types: Cigarettes  . Smokeless tobacco: Never Used  . Alcohol Use: No  . Drug Use: No  . Sexual Activity: Yes    Birth Control/ Protection: OCP   Other Topics Concern  . Not on file   Social History Narrative  Served in [employer] for 7.5 years  until 2014.    Review of Systems: Constitutional: positive for fevers and malaise Eyes: negative for visual changes Ears, nose, mouth, throat, and face: positive for dysphagia, lymphadenopathy, negative for sore throat, runny nose Respiratory: negative for cough, dyspnea on exertion and pleurisy/chest pain Cardiovascular: negative for chest pain Gastrointestinal: negative for abdominal pain and change in bowel habits Musculoskeletal: positive for weakness Allergic/Immunologic: negative for allergen (strawberry) exposure  Physical Exam: Blood pressure 109/67, pulse 84, temperature 99.5 F (37.5 C), temperature source Oral, resp. rate 15, height 5\' 7"  (1.702 m), weight 64.864 kg (143 lb), last menstrual period 10/30/2014, SpO2 98 %.   General appearance: alert, cooperative, appears stated age, lying in bed NAD Head: Normocephalic, atraumatic Eyes: faint yellow tint to sclera. PERRL, EOM's intact. Ears: hearing grossly intact bilaterally Nose: Nares normal. No drainage or sinus tenderness. Throat: exudates present on right tonsil Neck: moderate right sided submandibular adenopathy. Full range of motion Lungs: clear to auscultation bilaterally, normal work of breathing Heart: RRR, S1, S2 normal, no m/r/g Abdomen: soft, non-tender; bowel sounds normal Extremities: no edema Neurologic: A&Ox3, CN II-XII grossly intact. strength 5/5 in bilateral upper and lower extremities, 2+ grip strength.   Lab results: CBC    Component Value Date/Time   WBC 10.5 10/31/2014 0915   RBC 6.29* 10/31/2014 0915   HGB 18.3* 10/31/2014 0915   HCT 51.6* 10/31/2014 0915   PLT 102* 10/31/2014 0915   MCV 82.0 10/31/2014 0915   MCH 29.1 10/31/2014 0915   MCHC 35.5 10/31/2014 0915   RDW 15.0 10/31/2014 0915   LYMPHSABS 0.5* 10/31/2014 0915   MONOABS 0.3 10/31/2014 0915   EOSABS 0.0 10/31/2014 0915   BASOSABS 0.0 10/31/2014 0915   BMET    Component Value Date/Time   NA 132* 10/31/2014 0915   K 3.3*  10/31/2014 0915   CL 101 10/31/2014 0915   CO2 21 10/31/2014 0915   GLUCOSE 123* 10/31/2014 0915   BUN 8 10/31/2014 0915   CREATININE 0.86 10/31/2014 0915   CALCIUM 8.4 10/31/2014 0915   GFRNONAA >90 10/31/2014 0915   GFRAA >90 10/31/2014 0915   Lactic acid (10/31/14 1136): Lactic Acid, Venous: 0.91  Microbiology: Results for orders placed or performed during the hospital encounter of 10/31/14  Rapid strep screen     Status: None   Collection Time: 10/31/14  6:29 AM  Result Value Ref Range Status   Streptococcus, Group A Screen (Direct) NEGATIVE NEGATIVE Final    Comment: (NOTE) A Rapid Antigen test may result negative if the antigen level in the sample is below the detection level of this test. The FDA has not cleared this test as a stand-alone  test therefore the rapid antigen negative result has reflexed to a Group A Strep culture.      Imaging results:  Dg Chest 2 View  10/31/2014   CLINICAL DATA:  Right neck and chest pain.  Lump in the right neck.  EXAM: CHEST  2 VIEW  COMPARISON:  04/23/2014  FINDINGS: The airway plugging at the left lung apex is not readily visible on conventional radiography.  Airway thickening is present, suggesting bronchitis or reactive airways disease. No airspace opacity is identified.  Cardiac and mediastinal margins appear normal. No pleural effusion identified.  IMPRESSION: 1. Airway thickening is present, suggesting bronchitis or reactive airways disease.   Electronically Signed   By: Sherryl Barters M.D.   On: 10/31/2014 13:48   Ct Soft Tissue Neck W Contrast  10/31/2014   CLINICAL DATA:  Sore throat, mild cough, and knot on the right side of neck - onset 10/29/2014  EXAM: CT NECK WITH CONTRAST  TECHNIQUE: Multidetector CT imaging of the neck was performed using the standard protocol following the bolus administration of intravenous contrast.  CONTRAST:  42mL OMNIPAQUE IOHEXOL 300 MG/ML  SOLN  COMPARISON:  None.  FINDINGS: Pharynx and larynx: There  is fat edema surrounding the pharynx, with mild enlargement of the tonsils, asymmetric to the right (where there is also likely pus in the tonsillar crypts). Reactive cervical lymphadenopathy without suppurative change. In the region of maximal inflammation in the right neck, there is an eccentric, well-circumscribed contrast filling defect most consistent with early clot. This appearance is not typical for a mixing/flow artifact or valve. No retropharyngeal abscess or phlegmon.  Salivary glands: No primary inflammation  Thyroid: Negative  Lymph nodes: Reactive enlargement, as above.  Vascular: Right IJ partial thrombosis, as above  Limited intracranial: Low cerebellar tonsils without foramen magnum 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  Visualized orbits: Negative  Mastoids and visualized paranasal sinuses: Negative  Skeleton: Negative  Upper chest: A subsegmental bronchus in the left upper lobe is opacified, indistinct, and mildly expanded. No other bronchiectatic airways.  IMPRESSION: 1. Tonsillitis/pharyngitis with parapharyngeal edema and adenopathy. Small thrombus in the right IJ, neighboring the maximal inflammation. 2. 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 The appearance is not typical of septic embolus related to #1.   Electronically Signed   By: Jorje Guild M.D.   On: 10/31/2014 10:25    Assessment & Plan by Problem: Principal Problem:   Lemierre syndrome Active Problems:   Tobacco abuse   Hypokalemia   Reactive airway disease   Right ovarian cyst   Chiari I malformation   History of pancreatic disorder  Ms. Eleasha Heather is a 29yo woman with PMHx of pancreatic tumor and GERD who presents with 2 days of dysphagia.  Lemierre syndrome: Pt presented with 2 days dysphagia and fevers as well as  CT findings of IJ thrombus and parapharyngeal edema, diagnosis is Lemierre syndrome. Causal bacteria likely oropharyngeal in origin including Fusobacterium species and other organisms such as Eikenella corrodens, Porphyromonas asaccharolytica, streptococci including S. pyogenes, and Bacteroides (Spelman, D. Suppurative (septic) thrombophlebitis). In: UpToDate, Royetta Crochet (Jaquita Rector), [employer], [place], [place]. Idelle Leech on October 31, 2014).) Strep throat is unlikely given her negative rapid strep screen.  -- begin Unisyn 3g q6H -- 15mg  Toradol q6H PRN for pain -- f/u blood cultures -- possible ENT consult if symptoms persist or adenopathy worsens  Tobacco Abuse: pt  smokes approximately half a pack per day since age 34. Multiple quit attempts in the past. Has not smoked in past 3-4 days due to pain in neck. Pt declined nicotine patch today. -- smoking cessation counseling  Hypokalemia:  K+ 3.3 on admission -- one dose 40mg  K-dur -- f/u BMet   Reactive airway disease: bronchitis or RAD present on CXR. Possibly 2/2 to Lemierre syndrome. Pt with no current respiratory symptoms or complaints, not requiring treatment at this time. -- continue to monitor for respiratory symptoms  Right ovarian cyst: Pelvic US 04/23/2014 revealed probable right ovarian hemorrhagic cyst. Pt was recommended for 4-6 week follow-up. No evidence of follow-up noted in Epic.  -- repeat pelvic US during admission or as outpatient  Chiari I malformation: CT noted low cerebellar tonsils without foramen magnum crowding typical of acute Chiari 1. 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 Last lipase level 01/16/210 was 18. -- recheck lipase level  FEN/GI: -- continuous IV NS @ 130mL/hr -- NPO. Advance diet as tolerated  DVT PPx: -- Lovenox  Code Status: FULL CODE  Dispo: Disposition is deferred at this time, awaiting  improvement of current medical problems. Anticipated discharge deferred.   Unknown if pt has a current PCP.  Need for [facility] hospital follow-up appointment after discharge TBD.  Transportation limitations that hinder transportation to clinic appointments status unkown.  Signed: Louisa Second, Med Student 10/31/2014, 3:07 PM

## 2014-10-31 NOTE — ED Notes (Signed)
Pt given a cup of ice water

## 2014-10-31 NOTE — ED Notes (Addendum)
Cup of ice water given to pt ok'd by Santiago Glad, Therapist, sports

## 2014-10-31 NOTE — ED Notes (Signed)
Attempted to call report

## 2014-10-31 NOTE — H&P (Signed)
Date: 10/31/2014               Patient Name:  [name] [name] MRN: [id number]  DOB: 12/30/1985 Age / Sex: 29 y.o., female   PCP: Pcp Not In System              Medical Service: Internal Medicine Teaching Service              Attending Physician: Dr. Bartholomew Crews, MD    First Contact: Dr. Trudee Kuster Pager: [phone]  Second Contact: Dr. Naaman Plummer Pager: [phone]            After Hours (After 5p/  First Contact Pager: [phone]  weekends / holidays): Second Contact Pager: [phone]   Chief Complaint:  Sore throat  History of Present Illness: [name] [name] is a 29 year old woman with history of splenectomy with partial pancreatectomy for a pancreatic mass and cholecystectomy presenting with a sore throat.  She reports having a sore throat for the past two days that she notices when she swallows.  It is very tender to the touch and has been getting worse and is associated with otalgia.  She also reports a knot on the outside of her neck on the right side but not on the left that she noticed at the time her sore throat started.  She thinks it has been getting bigger, and she says her neck feels like she slept on it the wrong way.  She denies fevers, cough, difficulty breathing, or chest pain, but she does endorse some light-headedness and chills.  She has not looked in the back of her throat, but she denies any drainage, congestion, or face pain.  She denies any sick contacts.  She has tried warm compresses with some relief of her neck symptoms.    She was noted to have a fever to 104.4 in the ER along with swelling of her tonsils bilaterally.  She was given ibuprofen and Fentanyl to help with her pain.  She underwent a CT neck with contrast that demonstrated R>L tonsilitis, pharyngitis and lymphadenopathy with a small thrombus in the R IJ, neighboring the maximal inflammation.  Following administration of the contrast, she developed some itching and urticaria on her face and chest and diarrhea.  She  received IV solumedrol, epinephrine, and Benadryl with resolution.  Review of Systems: Review of Systems  Constitutional: Positive for chills. Negative for fever, weight loss and malaise/fatigue.  HENT: Positive for sore throat. Negative for congestion.   Eyes: Negative for blurred vision.  Respiratory: Negative for cough, sputum production and shortness of breath.   Cardiovascular: Negative for chest pain, claudication and leg swelling.  Gastrointestinal: Negative for nausea, vomiting, diarrhea and constipation.  Genitourinary: Negative for dysuria.  Musculoskeletal: Positive for neck pain. Negative for myalgias and joint pain.  Skin: Negative for rash.  Neurological: Negative for dizziness, focal weakness, seizures, weakness and headaches.  Psychiatric/Behavioral: Negative for depression.    Meds:  (Not in a hospital admission) Current Facility-Administered Medications  Medication Dose Route Frequency Provider Last Rate Last Dose  . clindamycin (CLEOCIN) IVPB 600 mg  600 mg Intravenous Once Viona Gilmore [place], PA-C   Stopped at 10/31/14 1059   Current Outpatient Prescriptions  Medication Sig Dispense Refill  . norethindrone-ethinyl estradiol-iron (MICROGESTIN FE,GILDESS FE,LOESTRIN FE) 1.5-30 MG-MCG tablet Take 1 tablet by mouth daily.    . cephALEXin (KEFLEX) 500 MG capsule Take 1 capsule (500 mg total) by mouth 4 (four) times daily. (Patient not taking: Reported on  09/28/2014) 20 capsule 0  . oxyCODONE-acetaminophen (PERCOCET/ROXICET) 5-325 MG per tablet Take 1-2 tablets by mouth every 4 (four) hours as needed for moderate pain or severe pain. (Patient not taking: Reported on 09/28/2014) 20 tablet 0  . Phenyleph-Doxylamine-DM-APAP (ALKA SELTZER PLUS PO) Take 1 each by mouth once.      Allergies: Allergies as of 10/31/2014 - Review Complete 10/31/2014  Allergen Reaction Noted  . Strawberry Anaphylaxis 06/06/2013  . Hydrocodone Hives 06/06/2013   Past Medical History  Diagnosis  Date  . Pancreatic tumor   . GERD (gastroesophageal reflux disease)   . Chronic abdominal pain    Past Surgical History  Procedure Laterality Date  . Splenectomy    . Cholecystectomy    . Pancreatectomy      partial   History reviewed. No pertinent family history. History   Social History  . Marital Status: Single    Spouse Name: N/A    Number of Children: N/A  . Years of Education: N/A   Occupational History  . Not on file.   Social History Main Topics  . Smoking status: Current Every Day Smoker  . Smokeless tobacco: Not on file  . Alcohol Use: No  . Drug Use: No  . Sexual Activity: Yes    Birth Control/ Protection: None   Other Topics Concern  . Not on file   Social History Narrative    Physical Exam: Filed Vitals:   10/31/14 1200  BP: 121/73  Pulse: 101  Temp: 99.5  Resp: 16   Physical Exam  Constitutional: She is oriented to person, place, and time and well-developed, well-nourished, and in no distress. No distress.  HENT:  Mouth/Throat: Oropharyngeal exudate (White tonsilar exudate and swelling R>L.) present.  Neck: No tracheal deviation present.  Pain with palpation and range of motion.  Cardiovascular: Regular rhythm and normal heart sounds.   Tachycardic.  Pulmonary/Chest: Effort normal and breath sounds normal. No stridor. No respiratory distress. She has no wheezes. She has no rales. She exhibits no tenderness.  Abdominal: Soft. Bowel sounds are normal. She exhibits no distension. There is no tenderness.  Musculoskeletal: She exhibits no edema.  Normal range of motion in extremities.  Lymphadenopathy:    She has cervical adenopathy (R>L.).  Neurological: She is alert and oriented to person, place, and time. Gait normal.  Skin: Skin is warm and dry. No rash noted. She is not diaphoretic. No erythema.    Lab results: Basic Metabolic Panel:  Recent Labs  10/31/14 0915  NA 132*  K 3.3*  CL 101  CO2 21  GLUCOSE 123*  BUN 8  CREATININE  0.86  CALCIUM 8.4   CBC:  Recent Labs  10/31/14 0915  WBC 10.5  NEUTROABS 9.6*  HGB 18.3*  HCT 51.6*  MCV 82.0  PLT 102*   Lactic Acid, Venous 0.91  Imaging results:  Ct Soft Tissue Neck W Contrast  10/31/2014   CLINICAL DATA:  Sore throat, mild cough, and knot on the right side of neck - onset 10/29/2014  EXAM: CT NECK WITH CONTRAST  TECHNIQUE: Multidetector CT imaging of the neck was performed using the standard protocol following the bolus administration of intravenous contrast.  CONTRAST:  44mL OMNIPAQUE IOHEXOL 300 MG/ML  SOLN  COMPARISON:  None.  FINDINGS: Pharynx and larynx: There is fat edema surrounding the pharynx, with mild enlargement of the tonsils, asymmetric to the right (where there is also likely pus in the tonsillar crypts). Reactive cervical lymphadenopathy without suppurative change. In the  region of maximal inflammation in the right neck, there is an eccentric, well-circumscribed contrast filling defect most consistent with early clot. This appearance is not typical for a mixing/flow artifact or valve. No retropharyngeal abscess or phlegmon.  Salivary glands: No primary inflammation  Thyroid: Negative  Lymph nodes: Reactive enlargement, as above.  Vascular: Right IJ partial thrombosis, as above  Limited intracranial: Low cerebellar tonsils without foramen magnum 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  Visualized orbits: Negative  Mastoids and visualized paranasal sinuses: Negative  Skeleton: Negative  Upper chest: A subsegmental bronchus in the left upper lobe is opacified, indistinct, and mildly expanded. No other bronchiectatic airways.  IMPRESSION: 1. Tonsillitis/pharyngitis with parapharyngeal edema and adenopathy. Small thrombus in the right IJ, neighboring the maximal inflammation. 2. Opacified and possibly expanded left upper lobe subsegmental bronchus, chronic airway inflammation (including  bronchopulmonary aspergillosis) versus bronchopneumonia. The appearance is not typical of septic embolus related to #1.   Electronically Signed   By: Jorje Guild M.D.   On: 10/31/2014 10:25    Assessment & Plan by Problem: Active Problems:   Pharyngitis   #Acute pharyngitis with Lemierre's syndrome Increasing sore throat over last few days. Fever on admission with tachycardia consistent with some systemic effects. CT scan of her neck showed inflammation of her tonsils greater on the right side consistent with acute pharyngitis/tonsillitis with no abscess or phlegmon, which was associated with a small thrombus in the right internal jugular vein. This is most consistent with Lemierre's syndrome, which is typically caused by inflammation related to anaerobic bacteria. Rapid strep negative, but we'll send for throat culture given high probability of bacterial infection. These clots typically resolve with treatment of the underlying infection. She received 3 L of normal saline in the ER, and was started on Zosyn and clindamycin. -Admit to stepdown. -No anticoagulation currently. -Continue Zosyn per pharmacy consult. -Stop clindamycin as this is not adding any additional coverage. -We'll give Toradol 30 mg now then 15 mg every 6 hours as needed. Escalate as needed for severe pain with history of hydrocodone allergy. -Throat culture and blood culture. -Normal saline at 100 mL per hour. -Consider ENT consult if not improving.  #Anaphylaxis Urticaria and diarrhea is most consistent with anaphylaxis from contrast. She responded to epinephrine, Benadryl and steroids. -Added contrast media to her allergy list. -Continue to monitor. -Benadryl every 6 hours as needed.   #Left upper lobe subsegmental bronchus Most likely bronchitis with no respiratory symptoms or cough. -Check 2 view chest x-ray.  #Elevated hemoglobin Likely dehydration. -Recheck CBC tomorrow.  #Hypokalemia Has been low in the  past. -Kdur 40 mEq. -Repeat BMP tomorrow with magnesium.  Dispo: Disposition is deferred at this time, awaiting improvement of current medical problems. Anticipated discharge in approximately 2-3 day(s).  The patient does not have a current PCP (Pcp Not In System), therefore will be require OPC follow-up after discharge.   The patient does not have transportation limitations that hinder transportation to clinic appointments.   Signed:  Arman Filter, MD, PhD PGY-1 Internal Medicine Teaching Service Pager: [phone] 10/31/2014, 12:54 PM

## 2014-10-31 NOTE — Progress Notes (Signed)
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 Contrast Media [Iodinated Diagnostic Agents] Hives  . Strawberry Anaphylaxis  . Hydrocodone Hives    Can take acetaminophen    Patient Measurements: Height: 5\' 7"  (170.2 cm) Weight: 143 lb (64.864 kg) IBW/kg (Calculated) : 61.6  Vital Signs: Temp: 99.5 F (37.5 C) (02/08 1158) Temp Source: Oral (02/08 1158) BP: 109/67 mmHg (02/08 1400) Pulse Rate: 84 (02/08 1400) Intake/Output from previous day:   Intake/Output from this shift:    Labs:  Recent Labs  10/31/14 0915  WBC 10.5  HGB 18.3*  PLT 102*  CREATININE 0.86   Estimated Creatinine Clearance: 94.7 mL/min (by C-G formula based on Cr of 0.86). No results for input(s): VANCOTROUGH, VANCOPEAK, VANCORANDOM, GENTTROUGH, GENTPEAK, GENTRANDOM, TOBRATROUGH, TOBRAPEAK, TOBRARND, AMIKACINPEAK, AMIKACINTROU, AMIKACIN in the last 72 hours.   Microbiology: Recent Results (from the past 720 hour(s))  Rapid strep screen     Status: None   Collection Time: 10/31/14  6:29 AM  Result Value Ref Range Status   Streptococcus, Group A Screen (Direct) NEGATIVE NEGATIVE Final    Comment: (NOTE) A Rapid Antigen test may result negative if the antigen level in the sample is below the detection level of this test. The FDA has not cleared this test as a stand-alone test therefore the rapid antigen negative result has reflexed to a Group A Strep culture.     Medical History: Past Medical History  Diagnosis Date  . Pancreatic tumor   . GERD (gastroesophageal reflux disease)   . Chronic abdominal pain     Medications:   (Not in a hospital admission)   Assessment: 29 yo F presents on 2/8 with a sore throat. Pt with possible pharyngitis / Lemierre's syndrome? Pharmacy to dose zosyn for Lemierre's synrome. Pt afebrile up to 104.4 in the ED, WBC wnl. Pt received Zosyn x 1 in the ED.  Goal of Therapy:   Resolution of infection  Plan:  Start Zosyn 3.375g IV Q8 tonight Monitor renal function, clinical picture  Leroy Trim J 10/31/2014,2:29 PM

## 2014-11-01 DIAGNOSIS — I8290 Acute embolism and thrombosis of unspecified vein: Secondary | ICD-10-CM

## 2014-11-01 DIAGNOSIS — I808 Phlebitis and thrombophlebitis of other sites: Principal | ICD-10-CM

## 2014-11-01 DIAGNOSIS — J029 Acute pharyngitis, unspecified: Secondary | ICD-10-CM

## 2014-11-01 DIAGNOSIS — R079 Chest pain, unspecified: Secondary | ICD-10-CM

## 2014-11-01 DIAGNOSIS — Z8719 Personal history of other diseases of the digestive system: Secondary | ICD-10-CM

## 2014-11-01 DIAGNOSIS — R7989 Other specified abnormal findings of blood chemistry: Secondary | ICD-10-CM

## 2014-11-01 DIAGNOSIS — E876 Hypokalemia: Secondary | ICD-10-CM

## 2014-11-01 LAB — COMPREHENSIVE METABOLIC PANEL
ALT: 14 U/L (ref 0–35)
ANION GAP: 8 (ref 5–15)
AST: 18 U/L (ref 0–37)
Albumin: 2.8 g/dL — ABNORMAL LOW (ref 3.5–5.2)
Alkaline Phosphatase: 76 U/L (ref 39–117)
BILIRUBIN TOTAL: 0.3 mg/dL (ref 0.3–1.2)
BUN: 11 mg/dL (ref 6–23)
CO2: 19 mmol/L (ref 19–32)
Calcium: 7.9 mg/dL — ABNORMAL LOW (ref 8.4–10.5)
Chloride: 107 mmol/L (ref 96–112)
Creatinine, Ser: 0.66 mg/dL (ref 0.50–1.10)
GFR calc non Af Amer: >90 mL/min (ref >90)
GLUCOSE: 228 mg/dL — AB (ref 70–99)
POTASSIUM: 3.4 mmol/L — AB (ref 3.5–5.1)
Sodium: 134 mmol/L — ABNORMAL LOW (ref 135–145)
Total Protein: 6.3 g/dL (ref 6.0–8.3)

## 2014-11-01 LAB — CBC
HEMATOCRIT: 31.3 % — AB (ref 36.0–46.0)
HEMOGLOBIN: 10.6 g/dL — AB (ref 12.0–15.0)
MCH: 28 pg (ref 26.0–34.0)
MCHC: 33.9 g/dL (ref 30.0–36.0)
MCV: 82.6 fL (ref 78.0–100.0)
Platelets: 193 10*3/uL (ref 150–400)
RBC: 3.79 MIL/uL — ABNORMAL LOW (ref 3.87–5.11)
RDW: 15.2 % (ref 11.5–15.5)
WBC: 23.8 10*3/uL — ABNORMAL HIGH (ref 4.0–10.5)

## 2014-11-01 LAB — MRSA PCR SCREENING: MRSA BY PCR: NEGATIVE

## 2014-11-01 LAB — TROPONIN I
Troponin I: 0.36 ng/mL — ABNORMAL HIGH (ref <0.031)
Troponin I: <0.03 ng/mL (ref <0.031)
Troponin I: 0.13 ng/mL — ABNORMAL HIGH (ref <0.031)

## 2014-11-01 LAB — MAGNESIUM: Magnesium: 2.2 mg/dL (ref 1.5–2.5)

## 2014-11-01 LAB — HEPARIN LEVEL (UNFRACTIONATED): Heparin Unfractionated: <0.1 IU/mL — ABNORMAL LOW (ref 0.30–0.70)

## 2014-11-01 MED ORDER — TRAMADOL HCL 50 MG PO TABS
50.0000 mg | ORAL_TABLET | Freq: Four times a day (QID) | ORAL | Status: DC | PRN
  Administered 2014-11-01 – 2014-11-04 (×2): 50 mg via ORAL
  Filled 2014-11-01 (×2): qty 1

## 2014-11-01 MED ORDER — POTASSIUM CHLORIDE CRYS ER 20 MEQ PO TBCR
40.0000 meq | EXTENDED_RELEASE_TABLET | Freq: Once | ORAL | Status: AC
  Administered 2014-11-01: 40 meq via ORAL
  Filled 2014-11-01: qty 2

## 2014-11-01 MED ORDER — HEPARIN BOLUS VIA INFUSION
3000.0000 [IU] | Freq: Once | INTRAVENOUS | Status: AC
  Administered 2014-11-01: 3000 [IU] via INTRAVENOUS
  Filled 2014-11-01: qty 3000

## 2014-11-01 MED ORDER — NITROGLYCERIN 0.4 MG SL SUBL
SUBLINGUAL_TABLET | SUBLINGUAL | Status: AC
  Administered 2014-11-01: 0.8 mg
  Filled 2014-11-01: qty 1

## 2014-11-01 MED ORDER — HEPARIN BOLUS VIA INFUSION
2000.0000 [IU] | Freq: Once | INTRAVENOUS | Status: DC
Start: 2014-11-01 — End: 2014-11-02
  Filled 2014-11-01: qty 2000

## 2014-11-01 MED ORDER — ASPIRIN 81 MG PO CHEW
324.0000 mg | CHEWABLE_TABLET | Freq: Every day | ORAL | Status: DC
  Administered 2014-11-01 – 2014-11-04 (×4): 324 mg via ORAL
  Filled 2014-11-01 (×4): qty 4

## 2014-11-01 MED ORDER — KETOROLAC TROMETHAMINE 15 MG/ML IJ SOLN
15.0000 mg | Freq: Four times a day (QID) | INTRAMUSCULAR | Status: DC | PRN
  Administered 2014-11-01 – 2014-11-04 (×7): 15 mg via INTRAVENOUS
  Filled 2014-11-01 (×9): qty 1

## 2014-11-01 MED ORDER — HEPARIN (PORCINE) IN NACL 100-0.45 UNIT/ML-% IJ SOLN
1250.0000 [IU]/h | INTRAMUSCULAR | Status: DC
  Administered 2014-11-01: 750 [IU]/h via INTRAVENOUS
  Filled 2014-11-01 (×3): qty 250

## 2014-11-01 NOTE — Progress Notes (Signed)
CRITICAL VALUE ALERT  Critical value received:  Blood culture: Anaerobic bottle with gram positive cocci in pairs and chains  Date of notification:  11/01/2014   Time of notification:  10:19 PM   Critical value read back: yes  Nurse who received alert:  Reatha Armour RN  MD notified (1st page):    Time of first page:  10:20 PM   MD notified (2nd page):  Time of second page:  Responding MD:  Genene Churn  Time MD responded: [id number]

## 2014-11-01 NOTE — Progress Notes (Signed)
ANTICOAGULATION CONSULT NOTE - Follow-up Consult  Pharmacy Consult for Heparin Indication: chest pain/ACS  Allergies  Allergen Reactions  . Contrast Media [Iodinated Diagnostic Agents] Anaphylaxis  . Strawberry Anaphylaxis  . Hydrocodone Hives    Can take acetaminophen    Patient Measurements: Height: 5\' 7"  (170.2 cm) Weight: 139 lb 5.3 oz (63.2 kg) IBW/kg (Calculated) : 61.6 Heparin Dosing Weight: 63.2 kg  Vital Signs: Temp: 98.4 F (36.9 C) (02/09 1141) Temp Source: Oral (02/09 1141) BP: 136/102 mmHg (02/09 1536) Pulse Rate: 62 (02/09 1536)  Labs:  Recent Labs  10/31/14 0915 11/01/14 0417 11/01/14 1021 11/01/14 1650  HGB 18.3* 10.6*  --   --   HCT 51.6* 31.3*  --   --   PLT 102* 193  --   --   HEPARINUNFRC  --   --   --  <0.10*  CREATININE 0.86 0.66  --   --   TROPONINI  --  0.36* <0.03 0.13*    Estimated Creatinine Clearance: 101.8 mL/min (by C-G formula based on Cr of 0.66).  Assessment: 29 yo F on heparin for r/o ACS. Heparin level undetectable on 750 units/hr. No bleeding noted. No issues with line per RN.  Goal of Therapy:  Heparin level 0.3-0.7 units/ml Monitor platelets by anticoagulation protocol: Yes   Plan:  Rebolus heparin 2000 units Increase heparin infusion to 1000 units/hr Heparin level in 6 hours  Sherlon Handing, PharmD, BCPS Clinical pharmacist, pager [phone] 11/01/2014 6:00 PM

## 2014-11-01 NOTE — Progress Notes (Signed)
Utilization Review Completed.  

## 2014-11-01 NOTE — Consult Note (Signed)
Admit date: 10/31/2014 Referring Physician  Dr. Lynnae January Primary Physician Pcp Not In System Primary Cardiologist  None Reason for Consultation  Chest pain  HPI: 29 year old admitted with sore throat, fever 104.4 with tonsillitis, lymphadenopathy, small thrombus in right IJ neighboring maximal inflammation with tenderness in her neck.  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 The pancreatic mass turned out to be benign she stated. She has had no prior thrombus or DVT. She has had a history of GERD. She denies smoking to me however it states half pack per day for 10 years. Her mother and father did not have early coronary artery disease.  After admission, at 4:37 AM she complained of intermittent chest tightness. 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 Patient's problem resolved after 2 nitroglycerin.  EKG performed at the time demonstrated sinus rhythm with marked sinus arrhythmia, subtle J-point elevation diffusely. PVCs are noted on telemetry.  Troponin was drawn and was elevated at 0.36.  Albumin is noted to be 2.8, likely acute phase reactant in her acute illness. White blood cell count is 23.8.  PMH:   Past Medical History  Diagnosis Date  . Pancreatic tumor   . GERD (gastroesophageal reflux disease)   . Chronic abdominal pain     PSH:   Past Surgical History  Procedure Laterality Date  . Splenectomy    . Cholecystectomy    . Pancreatectomy      partial   Allergies:  Contrast media; Strawberry; and Hydrocodone Prior to Admit Meds:   Prior to Admission medications   Medication Sig Start Date End Date Taking? Authorizing Provider  norethindrone-ethinyl estradiol-iron  (MICROGESTIN FE,GILDESS FE,LOESTRIN FE) 1.5-30 MG-MCG tablet Take 1 tablet by mouth daily.   Yes Historical Provider, MD  Phenyleph-Doxylamine-DM-APAP (ALKA SELTZER PLUS PO) Take 1 each by mouth once.    Historical Provider, MD   Fam HX:    Family History  Problem Relation Age of Onset  . Hypertension Mother   . Diabetes Mother   . Cancer Mother     Ovarian, Breast  . Hypertension Father   . Diabetes Father    Social HX:    History   Social History  . Marital Status: Single    Spouse Name: N/A    Number of Children: 0  . Years of Education: N/A   Occupational History  . [employer]     Finished 02/15.   Social History Main Topics  . Smoking status: Current Every Day Smoker -- 0.50 packs/day for 10 years    Types: Cigarettes  . Smokeless tobacco: Never Used  . Alcohol Use: No  . Drug Use: No  . Sexual Activity: Yes    Birth Control/ Protection: OCP   Other Topics Concern  . Not on file   Social History Narrative     ROS:  Febrile, throat pain, neck pain All 11 ROS were addressed and are negative except what is stated in the HPI   Physical Exam: Blood pressure 117/78, pulse 73, temperature 98.4 F (36.9 C), temperature source Oral, resp. rate 15, height 5\' 7"  (1.702 m), weight 139 lb 5.3 oz (63.2 kg), last menstrual period 10/30/2014, SpO2 98 %.  General: Well developed, well nourished, in no acute distress Head: Eyes PERRLA, No xanthomas. Minimal tenderness neck palpation.  Normal cephalic and atramatic  Lungs:   Clear bilaterally to auscultation and percussion. Normal respiratory effort. No wheezes, no rales. Heart:   HRRR S1 S2 Pulses are 2+ & equal. No murmur, no rubs, gallops.  No carotid bruit. No JVD.  No abdominal bruits.  Abdomen: Bowel sounds are positive, abdomen soft and non-tender without masses. No hepatosplenomegaly. Msk:  Back normal. Normal strength and tone for age. Extremities:  No clubbing, cyanosis or edema.  DP +1, no unilateral swelling Neuro:  Alert and oriented X 3, non-focal, MAE x 4 GU: Deferred Rectal: Deferred Psych:  Good affect, responds appropriately      Labs: Lab Results  Component Value Date   WBC 23.8* 11/01/2014   HGB 10.6* 11/01/2014   HCT 31.3* 11/01/2014   MCV 82.6 11/01/2014   PLT 193 11/01/2014     Recent Labs Lab 11/01/14 0417  NA 134*  K 3.4*  CL 107  CO2 19  BUN 11  CREATININE 0.66  CALCIUM 7.9*  PROT 6.3  BILITOT 0.3  ALKPHOS 76  ALT 14  AST 18  GLUCOSE 228*    Recent Labs  11/01/14 0417  TROPONINI 0.36*     Radiology:  Dg Chest 2 View  10/31/2014   CLINICAL DATA:  Right neck and chest pain.  Lump in the right neck.  EXAM: CHEST  2 VIEW  COMPARISON:  04/23/2014  FINDINGS: The airway plugging at the left lung apex is not readily visible on conventional radiography.  Airway thickening is present, suggesting bronchitis or reactive airways disease. No airspace opacity is identified.  Cardiac and mediastinal margins appear normal. No pleural effusion identified.  IMPRESSION: 1. Airway thickening is present, suggesting bronchitis or reactive airways disease.   Electronically Signed   By: Sherryl Barters M.D.   On: 10/31/2014 13:48   Ct Soft Tissue Neck W Contrast  10/31/2014   CLINICAL DATA:  Sore throat, mild cough, and knot on the right side of neck - onset 10/29/2014  EXAM: CT NECK WITH CONTRAST  TECHNIQUE: Multidetector CT imaging of the neck was performed using the standard protocol following the bolus administration of intravenous contrast.  CONTRAST:  54mL OMNIPAQUE IOHEXOL 300 MG/ML  SOLN  COMPARISON:  None.  FINDINGS: Pharynx and larynx: There is fat edema surrounding the pharynx, with mild enlargement of the tonsils, asymmetric to the right (where there is also likely pus in the tonsillar crypts). Reactive cervical lymphadenopathy without suppurative change. In the region of maximal inflammation in the right neck, there is an eccentric, well-circumscribed contrast filling defect most  consistent with early clot. This appearance is not typical for a mixing/flow artifact or valve. No retropharyngeal abscess or phlegmon.  Salivary glands: No primary inflammation  Thyroid: Negative  Lymph nodes: Reactive enlargement, as above.  Vascular: Right IJ partial thrombosis, as above  Limited intracranial: Low cerebellar tonsils without foramen magnum 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  Visualized orbits: Negative  Mastoids and visualized paranasal sinuses: Negative  Skeleton: Negative  Upper chest: A subsegmental bronchus in the left upper lobe is opacified, indistinct, and mildly expanded. No other bronchiectatic airways.  IMPRESSION: 1. Tonsillitis/pharyngitis with parapharyngeal edema and adenopathy. Small thrombus in the right IJ, neighboring the maximal inflammation. 2. 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  The appearance is not typical of septic embolus related to #1.   Electronically Signed   By: Jorje Guild M.D.   On: 10/31/2014 10:25   Personally viewed.  EKG:  As described above Personally viewed. PVCs noted on telemetry.  ASSESSMENT/PLAN:    29 year old with severe fever of 104, tonsillitis with reactive lymphadenopathy and associated right internal jugular partial thrombus with leukocytosis, transient chest pain currently resolved which resulted in troponin blood draw which was mildly elevated at 0.36.  1. Elevated troponin-this is likely secondary to her acute underlying illness, fever, tonsillitis with marked elevated white count. Other things to consider given her partial thrombus in right IJ is possibility of pulmonary embolism causing mildly elevated troponin although there is no evidence of unilateral upper extremity swelling and no evidence of shortness of breath/tachycardia,  other clinical symptoms. Nonetheless, she is being anticoagulated. EKG is not suggestive of acute coronary syndrome.  Recommendation is to continue to trend troponin (looking for flat curve).  I would also recommend checking an echocardiogram to ensure proper structure and function and to also evaluate for possibility of pericardial effusion which would coincide with her inflammatory process and perhaps could be related to her minimally elevated troponin and transient chest pain.  We will follow along with you. Thankfully, currently she appears quite comfortable and feels better from her tonsillitis perspective.  Candee Furbish, MD  11/01/2014  10:23 AM

## 2014-11-01 NOTE — Progress Notes (Signed)
ANTICOAGULATION CONSULT NOTE - Initial Consult  Pharmacy Consult for Heparin Indication: chest pain/ACS  Allergies  Allergen Reactions  . Contrast Media [Iodinated Diagnostic Agents] Anaphylaxis  . Strawberry Anaphylaxis  . Hydrocodone Hives    Can take acetaminophen    Patient Measurements: Height: 5\' 7"  (170.2 cm) Weight: 139 lb 5.3 oz (63.2 kg) IBW/kg (Calculated) : 61.6 Heparin Dosing Weight: 63.2 kg  Vital Signs: Temp: 98.4 F (36.9 C) (02/09 0827) Temp Source: Oral (02/09 0827) BP: 117/78 mmHg (02/09 0827) Pulse Rate: 73 (02/09 0827)  Labs:  Recent Labs  10/31/14 0915 11/01/14 0417  HGB 18.3* 10.6*  HCT 51.6* 31.3*  PLT 102* 193  CREATININE 0.86 0.66  TROPONINI  --  0.36*    Estimated Creatinine Clearance: 101.8 mL/min (by C-G formula based on Cr of 0.66).   Medical History: Past Medical History  Diagnosis Date  . Pancreatic tumor   . GERD (gastroesophageal reflux disease)   . Chronic abdominal pain     Medications:  Scheduled:  . ampicillin-sulbactam (UNASYN) IV  3 g Intravenous 4 times per day  . aspirin  324 mg Oral Daily  . clindamycin (CLEOCIN) IV  600 mg Intravenous Once  . enoxaparin (LOVENOX) injection  40 mg Subcutaneous Q24H  . potassium chloride  40 mEq Oral Once   Infusions:  . sodium chloride 100 mL/hr at 10/31/14 1536    Assessment: 29 yo F who presented to the ED 2/8 PM with 2 day hx of dysphagia.  Early this morning pt reported chest pressure, relieved by SL NTG.  Trop slightly elevated.  To start heparin for ACS.  Of note, pt received Lovenox 40mg  SQ yesterday at 1530.  Goal of Therapy:  Heparin level 0.3-0.7 units/ml Monitor platelets by anticoagulation protocol: Yes   Plan:  D/C Lovenox Heparin 3000 unit IV bolus x 1 Heparin infusion at 750 units/hr Heparin level in 6 hours. Heparin level and CBC daily while on heparin.  [employer], Pharm.D., BCPS Clinical Pharmacist Pager [phone] 11/01/2014 8:52 AM

## 2014-11-01 NOTE — Progress Notes (Signed)
Subjective:    Currently, the patient reports improvement of her sore throat. Endorses improved mobility of her neck and decreased tenderness. She had an episode of chest tightness last night that responded to nitroglycerin. She denies any chest pain or shortness of breath currently.  Interval Events: -Troponin elevated to 0.36 with normal EKG during episode of chest tightness. Some PVCs on telemetry. -Started on heparin per pharmacy consult. -Afebrile and vital signs stable.    Objective:    Vital Signs:   Temp:  [97.8 F (36.6 C)-98.5 F (36.9 C)] 98.4 F (36.9 C) (02/09 1141) Pulse Rate:  [73-108] 80 (02/09 1141) Resp:  [12-26] 24 (02/09 1141) BP: (100-139)/(60-118) 139/118 mmHg (02/09 1141) SpO2:  [96 %-100 %] 100 % (02/09 1141) Weight:  [136 lb 7.4 oz (61.9 kg)-139 lb 5.3 oz (63.2 kg)] 139 lb 5.3 oz (63.2 kg) (02/09 0500) Last BM Date: 11/01/14  24-hour weight change: Weight change: -6 lb 8.6 oz (-2.964 kg)  Intake/Output:   Intake/Output Summary (Last 24 hours) at 11/01/14 1229 Last data filed at 11/01/14 1856  Gross per 24 hour  Intake   1320 ml  Output      0 ml  Net   1320 ml      Physical Exam: General: Vital signs reviewed and noted. Well-developed, well-nourished, in no acute distress; alert, appropriate and cooperative throughout examination.  Lungs:  Normal respiratory effort. Clear to auscultation BL without crackles or wheezes.  Heart: Regular rate, irregular rhythm. S1 and S2 normal without gallop, murmur, or rubs.  Abdomen:  BS normoactive. Soft, Nondistended, non-tender.  No masses or organomegaly.  Extremities: No pretibial edema.     Labs:  Basic Metabolic Panel:  Recent Labs Lab 10/31/14 0915 11/01/14 0417  NA 132* 134*  K 3.3* 3.4*  CL 101 107  CO2 21 19  GLUCOSE 123* 228*  BUN 8 11  CREATININE 0.86 0.66  CALCIUM 8.4 7.9*  MG  --  2.2    Liver Function Tests:  Recent Labs Lab 11/01/14 0417  AST 18  ALT 14  ALKPHOS 76    BILITOT 0.3  PROT 6.3  ALBUMIN 2.8*   CBC:  Recent Labs Lab 10/31/14 0915 11/01/14 0417  WBC 10.5 23.8*  NEUTROABS 9.6*  --   HGB 18.3* 10.6*  HCT 51.6* 31.3*  MCV 82.0 82.6  PLT 102* 193    Cardiac Enzymes:  Recent Labs Lab 11/01/14 0417 11/01/14 1021  TROPONINI 0.36* <0.03    Microbiology: Results for orders placed or performed during the hospital encounter of 10/31/14  Rapid strep screen     Status: None   Collection Time: 10/31/14  6:29 AM  Result Value Ref Range Status   Streptococcus, Group A Screen (Direct) NEGATIVE NEGATIVE Final    Comment: (NOTE) A Rapid Antigen test may result negative if the antigen level in the sample is below the detection level of this test. The FDA has not cleared this test as a stand-alone test therefore the rapid antigen negative result has reflexed to a Group A Strep culture.   Blood culture (routine x 2)     Status: None (Preliminary result)   Collection Time: 10/31/14 11:18 AM  Result Value Ref Range Status   Specimen Description BLOOD RIGHT FOREARM  Final   Special Requests BOTTLES DRAWN AEROBIC AND ANAEROBIC 5CC  Final   Culture   Final           BLOOD CULTURE RECEIVED NO GROWTH TO DATE CULTURE WILL  BE HELD FOR 5 DAYS BEFORE ISSUING A FINAL NEGATIVE REPORT Performed at [facility]    Report Status PENDING  Incomplete  Blood culture (routine x 2)     Status: None (Preliminary result)   Collection Time: 10/31/14 11:26 AM  Result Value Ref Range Status   Specimen Description BLOOD LEFT FOREARM  Final   Special Requests BOTTLES DRAWN AEROBIC AND ANAEROBIC 5CC  Final   Culture   Final           BLOOD CULTURE RECEIVED NO GROWTH TO DATE CULTURE WILL BE HELD FOR 5 DAYS BEFORE ISSUING A FINAL NEGATIVE REPORT Performed at [facility]    Report Status PENDING  Incomplete  MRSA PCR Screening     Status: None   Collection Time: 10/31/14 11:27 PM  Result Value Ref Range Status   MRSA by PCR NEGATIVE NEGATIVE  Final    Comment:        The GeneXpert MRSA Assay (FDA approved for NASAL specimens only), is one component of a comprehensive MRSA colonization surveillance program. It is not intended to diagnose MRSA infection nor to guide or monitor treatment for MRSA infections.     Other results: EKG: Sinus rhythm with change in rate, likely due to inspiration.  Imaging: Dg Chest 2 View  10/31/2014   CLINICAL DATA:  Right neck and chest pain.  Lump in the right neck.  EXAM: CHEST  2 VIEW  COMPARISON:  04/23/2014  FINDINGS: The airway plugging at the left lung apex is not readily visible on conventional radiography.  Airway thickening is present, suggesting bronchitis or reactive airways disease. No airspace opacity is identified.  Cardiac and mediastinal margins appear normal. No pleural effusion identified.  IMPRESSION: 1. Airway thickening is present, suggesting bronchitis or reactive airways disease.   Electronically Signed   By: Sherryl Barters M.D.   On: 10/31/2014 13:48   Ct Soft Tissue Neck W Contrast  10/31/2014   CLINICAL DATA:  Sore throat, mild cough, and knot on the right side of neck - onset 10/29/2014  EXAM: CT NECK WITH CONTRAST  TECHNIQUE: Multidetector CT imaging of the neck was performed using the standard protocol following the bolus administration of intravenous contrast.  CONTRAST:  72mL OMNIPAQUE IOHEXOL 300 MG/ML  SOLN  COMPARISON:  None.  FINDINGS: Pharynx and larynx: There is fat edema surrounding the pharynx, with mild enlargement of the tonsils, asymmetric to the right (where there is also likely pus in the tonsillar crypts). Reactive cervical lymphadenopathy without suppurative change. In the region of maximal inflammation in the right neck, there is an eccentric, well-circumscribed contrast filling defect most consistent with early clot. This appearance is not typical for a mixing/flow artifact or valve. No retropharyngeal abscess or phlegmon.  Salivary glands: No primary  inflammation  Thyroid: Negative  Lymph nodes: Reactive enlargement, as above.  Vascular: Right IJ partial thrombosis, as above  Limited intracranial: Low cerebellar tonsils without foramen magnum 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  Visualized orbits: Negative  Mastoids and visualized paranasal sinuses: Negative  Skeleton: Negative  Upper chest: A subsegmental bronchus in the left upper lobe is opacified, indistinct, and mildly expanded. No other bronchiectatic airways.  IMPRESSION: 1. Tonsillitis/pharyngitis with parapharyngeal edema and adenopathy. Small thrombus in the right IJ, neighboring the maximal inflammation. 2. 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 The appearance is not typical of  septic embolus related to #1.   Electronically Signed   By: Jorje Guild M.D.   On: 10/31/2014 10:25       Medications:    Infusions: . sodium chloride 100 mL/hr at 10/31/14 1536  . heparin 750 Units/hr (11/01/14 0955)    Scheduled Medications: . ampicillin-sulbactam (UNASYN) IV  3 g Intravenous 4 times per day  . aspirin  324 mg Oral Daily  . clindamycin (CLEOCIN) IV  600 mg Intravenous Once    PRN Medications: acetaminophen, diphenhydrAMINE, ketorolac   Assessment/ Plan:    Principal Problem:   Lemierre syndrome Active Problems:   Tobacco abuse   History of pancreatic disorder   Oral contraceptive use   Hypokalemia   Reactive airway disease   Right ovarian cyst   Chiari I malformation  #Acute pharyngitis with Lemierre's syndrome Throat pain is improved along with tenderness. She remains afebrile. Chest CT with no evidence of septic thrombus. Chest x-ray demonstrates airway thickening consistent with bronchitis or reactive airway disease, but no evidence of pneumonia. -Continue Unasyn IV.  She will need to continue for 2 weeks prior to transitioning to oral antibiotics. -Place PICC prior to discharge from hospital. -Switch from Toradol to tramadol now that she can swallow and with elevated cardiac enzymes. -Follow-up throat in blood culture. -Stop normal saline now that she's eating. -Regular diet.  #Elevated troponins Associated with chest pain. She does say that her father had a myocardial infarction in his 71s, and her mother had a myocardial infarction as well. She is otherwise healthy and does not have any risk factors other than her smoking. Cardiology consulted and felt this elevation was most likely due to her pharyngitis. They recommend checking an echocardiogram and trending her troponins. They also mention possibility of pulmonary embolism from her right IJ. -Continue heparin IV per pharmacy consult. -Trend troponins. -Check echocardiogram. Look for right heart strain.  -Aspirin 324 mg daily.  #Anaphylaxis No further symptoms of anaphylaxis. -Benadryl every 6 hours as needed.  #Hypokalemia Potassium again low at 3.4 this morning. Magnesium was normal at 2.2. She did have some diarrhea in the ER which may have contributed, but has now resolved. -Supplement potassium with potassium chloride 40 mEq.   DVT PPX - heparin  CODE STATUS - Full.  CONSULTS PLACED - Cardiology.  DISPO - Disposition is deferred at this time, awaiting improvement of pharyngitis and cardiology evaluation.   Anticipated discharge in approximately 1-3 day(s).   The patient does not have a current PCP (Pcp Not In System) and does need an [facility] hospital follow-up appointment after discharge.    Is the [facility] hospital follow-up appointment a one-time only appointment? yes.  Does the patient have transportation limitations that hinder transportation to clinic appointments? no   SERVICE NEEDED AT [place]         Y = Yes, Blank = No PT:   OT:     RN:   Equipment:   Other:      Length of Stay: 1 day(s)   Signed: Arman Filter, MD  PGY-1, Internal Medicine Resident Pager: [phone] (7AM-5PM) 11/01/2014, 12:29 PM

## 2014-11-01 NOTE — Progress Notes (Signed)
Patient  Is not having loose stools. Pt states BM was an isolated incident and no further stools noted.

## 2014-11-01 NOTE — Progress Notes (Signed)
CRITICAL VALUE ALERT  Critical value received:  Positive Blood Cultures  Date of notification:  10/29/2014  Time of notification:  2707  Critical value read back:Yes.    Nurse who received alert:  Lester Kinsman RN  MD notified (1st page): Dr. Trudee Kuster  Time of first page:  [phone]  MD notified (2nd page):  Time of second page:  Responding MD: Dr. Trudee Kuster  Time MD responded:  [phone]

## 2014-11-01 NOTE — Progress Notes (Signed)
Subjective:    Currently, the patient is doing well. Overnight, pt experienced chest tightness (see below). She states that the throat pain is much improved and she has no dysphagia. She is eating/drinking well.  She has full range of motion without pain and swelling has gone down. She has had some dyspnea and palpitations. At this time, no CP, SOB, fever. Pt does not want nicotine patch at this time. Pt was able to elaborate on PMHx of pancreatic disease: surgery was done at [facility] [place]; pancreatic mass was benign, although she could not recall specific diagnosis. Family history: father had MI early 17s, and there is unspecified cardiac disease in maternal relatives.   Interval Events: -- overnight episode of chest tightness, resolved with nitroglycerin x2. EKG normal. Troponin 0.36 -- switch from Zosyn to Unisyn   Objective:    Vital Signs:   Temp:  [97.8 F (36.6 C)-102.1 F (38.9 C)] 98.4 F (36.9 C) (02/09 0827) Pulse Rate:  [73-123] 73 (02/09 0827) Resp:  [12-26] 15 (02/09 0827) BP: (100-128)/(60-81) 117/78 mmHg (02/09 0827) SpO2:  [96 %-100 %] 98 % (02/09 0827) Weight:  [61.9 kg (136 lb 7.4 oz)-63.2 kg (139 lb 5.3 oz)] 63.2 kg (139 lb 5.3 oz) (02/09 0500) Last BM Date: 10/31/14  24-hour weight change: Weight change: -2.964 kg (-6 lb 8.6 oz)  Intake/Output:   Intake/Output Summary (Last 24 hours) at 11/01/14 1019 Last data filed at 11/01/14 5732  Gross per 24 hour  Intake   1320 ml  Output      0 ml  Net   1320 ml      Physical Exam: General appearance: alert, cooperative, lying in bed NAD HEENT: Normocephalic, atraumatic. EOMI. Hearing grossly intact. exudates present on right tonsil Neck: mild right sided submandibular adenopathy; 1cm palpable node. Full range of motion Lungs: CTAB normal work of breathing Heart: PVCs, S1, S2 normal, no m/r/g Abdomen: soft, tender to palpation in epigastric region at old incision site; bowel sounds  normal Extremities: no edema Neurologic: A&Ox3, no focal abnormalities  Labs:  Basic Metabolic Panel:  Recent Labs Lab 11/01/14 0417  NA 134*  K 3.4*  CL 107  CO2 19  GLUCOSE 228*  BUN 11  CREATININE 0.66  CALCIUM 7.9*  MG 2.2    Liver Function Tests:  Recent Labs Lab 11/01/14 0417  AST 18  ALT 14  ALKPHOS 76  BILITOT 0.3  PROT 6.3  ALBUMIN 2.8*    CBC:  Recent Labs Lab 10/31/14 0915 11/01/14 0417  WBC 10.5 23.8*  NEUTROABS 9.6*  --   HGB 18.3* 10.6*  HCT 51.6* 31.3*  MCV 82.0 82.6  PLT 102* 193    Cardiac Enzymes:  Recent Labs Lab 11/01/14 0417  TROPONINI 0.36*    Microbiology: Results for orders placed or performed during the hospital encounter of 10/31/14  Rapid strep screen     Status: None   Collection Time: 10/31/14  6:29 AM  Result Value Ref Range Status   Streptococcus, Group A Screen (Direct) NEGATIVE NEGATIVE Final    Comment: (NOTE) A Rapid Antigen test may result negative if the antigen level in the sample is below the detection level of this test. The FDA has not cleared this test as a stand-alone test therefore the rapid antigen negative result has reflexed to a Group A Strep culture.   MRSA PCR Screening     Status: None   Collection Time: 10/31/14 11:27 PM  Result Value Ref Range Status  MRSA by PCR NEGATIVE NEGATIVE Final    Comment:        The GeneXpert MRSA Assay (FDA approved for NASAL specimens only), is one component of a comprehensive MRSA colonization surveillance program. It is not intended to diagnose MRSA infection nor to guide or monitor treatment for MRSA infections.     Other results: EKG 11/01/14 0402: Sinus rhythm with occasional Premature ventricular complexes. Otherwise normal ECG  Imaging: Dg Chest 2 View  10/31/2014   CLINICAL DATA:  Right neck and chest pain.  Lump in the right neck.  EXAM: CHEST  2 VIEW  COMPARISON:  04/23/2014  FINDINGS: The airway plugging at the left lung apex is not  readily visible on conventional radiography.  Airway thickening is present, suggesting bronchitis or reactive airways disease. No airspace opacity is identified.  Cardiac and mediastinal margins appear normal. No pleural effusion identified.  IMPRESSION: 1. Airway thickening is present, suggesting bronchitis or reactive airways disease.   Electronically Signed   By: Sherryl Barters M.D.   On: 10/31/2014 13:48   Ct Soft Tissue Neck W Contrast  10/31/2014   CLINICAL DATA:  Sore throat, mild cough, and knot on the right side of neck - onset 10/29/2014  EXAM: CT NECK WITH CONTRAST  TECHNIQUE: Multidetector CT imaging of the neck was performed using the standard protocol following the bolus administration of intravenous contrast.  CONTRAST:  107mL OMNIPAQUE IOHEXOL 300 MG/ML  SOLN  COMPARISON:  None.  FINDINGS: Pharynx and larynx: There is fat edema surrounding the pharynx, with mild enlargement of the tonsils, asymmetric to the right (where there is also likely pus in the tonsillar crypts). Reactive cervical lymphadenopathy without suppurative change. In the region of maximal inflammation in the right neck, there is an eccentric, well-circumscribed contrast filling defect most consistent with early clot. This appearance is not typical for a mixing/flow artifact or valve. No retropharyngeal abscess or phlegmon.  Salivary glands: No primary inflammation  Thyroid: Negative  Lymph nodes: Reactive enlargement, as above.  Vascular: Right IJ partial thrombosis, as above  Limited intracranial: Low cerebellar tonsils without foramen magnum 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  Visualized orbits: Negative  Mastoids and visualized paranasal sinuses: Negative  Skeleton: Negative  Upper chest: A subsegmental bronchus in the left upper lobe is opacified, indistinct, and mildly expanded. No other bronchiectatic airways.  IMPRESSION: 1. Tonsillitis/pharyngitis  with parapharyngeal edema and adenopathy. Small thrombus in the right IJ, neighboring the maximal inflammation. 2. 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 The appearance is not typical of septic embolus related to #1.   Electronically Signed   By: Jorje Guild M.D.   On: 10/31/2014 10:25       Medications:    Infusions: . sodium chloride 100 mL/hr at 10/31/14 1536  . heparin 750 Units/hr (11/01/14 0955)    Scheduled Medications: . ampicillin-sulbactam (UNASYN) IV  3 g Intravenous 4 times per day  . aspirin  324 mg Oral Daily  . clindamycin (CLEOCIN) IV  600 mg Intravenous Once    PRN Medications: acetaminophen, diphenhydrAMINE, ketorolac   Assessment/ Plan:    Pt is a 29 y.o. 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 Interventions at this time will be  focused on antibiotic treatment, pain management, and cardiac workup.   Acute Pharyngitis/Lemierre syndrome: Pt presented with 2 days dysphagia and fevers as well as CT findings of IJ thrombus and parapharyngeal edema; diagnosis is Lemierre syndrome. White count increased to 23.8 today from 10.5 on admission. Pt will require 2 weeks IV antibiotics (unisyn) followed by 2 weeks PO antibiotics. -- continue Unisyn 3g q6H -- 15mg  Toradol q6H PRN for pain -- 500mg  acetaminophen q6H PRN for pain -- f/u blood and throat cultures -- PICC line placement prior to discharge -- possible ENT consult if symptoms persist or adenopathy worsens  Elevated Troponin: Per cardiology, elevated troponin likely 2/2 to acute underlying illness as she also has had fever, elevated white count. Unlikely that this is pulmonary embolism given lack of  upper extremity swelling, SOB, or tachycardia. EKG does not suggest acute coronary syndrome.  -- begin 324mg  aspirin daily -- serial troponin measurement -- heparin bolus 3000units, then continuous IV 750units/hr -- obtain echo for evaluation of possible pericardia effusion  Hypokalemia: K+ 3.3 on admission. Given 1 dose 40mg  K-dur. K+ 3.4 on 11/01/14 -- give one dose 40mg  K-dur -- f/u BMet  Elevated hemoglobin: Hb 18.3 on admission, attributed to likely dehydration. Baseline ~13. Repeat CBC showed Hb 10.6 -- continue to monitor  Right ovarian cyst: Pelvic US 04/23/2014 revealed probable right ovarian hemorrhagic cyst. Pt was recommended for 4-6 week follow-up. No evidence of follow-up noted in Epic. Currently asymptomatic.  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 -- f/u MRI in 6 months as outpatient   FEN/GI: -- continuous IV NS @ 142mL/hr -- regular diet  DVT PPx: -- heparin as above  Code Status: FULL CODE  Consults placed:  -- Cardiology  Dispo: Disposition deferred at this time, awaiting improvement of current medial problems. Remain admitted to SDU. Anticipated discharge in approximately 2-3 days.   The patient does have a current PCP (PCP Not in system - Dr. Doneta Public, need to confirm with pt). Will require OPC follow-up after discharge.   The patient does not have transportation limtations that hinder transportation to clinic appointments.   SERVICE NEEDED AT [place]         Y = Yes, Blank = No PT:   OT:   RN:   Equipment:   Other:     Length of Stay: 1 day(s)   Signed: Louisa Second, Med Student  Pager: [phone] (7AM-5PM) 11/01/2014, 10:19 AM

## 2014-11-01 NOTE — Progress Notes (Signed)
Patient had complaint of intermittent chest tightening. When asked to rate pain, patient denied having pain but rather described a tight sensation.  2 sublingual nitroglycerins given, EKG complete, patient's problem resolved. MD made aware.

## 2014-11-02 DIAGNOSIS — E876 Hypokalemia: Secondary | ICD-10-CM | POA: Diagnosis present

## 2014-11-02 DIAGNOSIS — R7889 Finding of other specified substances, not normally found in blood: Secondary | ICD-10-CM

## 2014-11-02 DIAGNOSIS — A491 Streptococcal infection, unspecified site: Secondary | ICD-10-CM | POA: Diagnosis present

## 2014-11-02 DIAGNOSIS — R072 Precordial pain: Secondary | ICD-10-CM

## 2014-11-02 DIAGNOSIS — R748 Abnormal levels of other serum enzymes: Secondary | ICD-10-CM

## 2014-11-02 DIAGNOSIS — I319 Disease of pericardium, unspecified: Secondary | ICD-10-CM

## 2014-11-02 LAB — CBC
HCT: 29.8 % — ABNORMAL LOW (ref 36.0–46.0)
Hemoglobin: 10.1 g/dL — ABNORMAL LOW (ref 12.0–15.0)
MCH: 27.7 pg (ref 26.0–34.0)
MCHC: 33.9 g/dL (ref 30.0–36.0)
MCV: 81.9 fL (ref 78.0–100.0)
Platelets: 182 10*3/uL (ref 150–400)
RBC: 3.64 MIL/uL — ABNORMAL LOW (ref 3.87–5.11)
RDW: 15.2 % (ref 11.5–15.5)
WBC: 20.5 10*3/uL — ABNORMAL HIGH (ref 4.0–10.5)

## 2014-11-02 LAB — HEPARIN LEVEL (UNFRACTIONATED): Heparin Unfractionated: 0.11 [IU]/mL — ABNORMAL LOW (ref 0.30–0.70)

## 2014-11-02 LAB — BASIC METABOLIC PANEL
ANION GAP: 5 (ref 5–15)
BUN: 8 mg/dL (ref 6–23)
CHLORIDE: 104 mmol/L (ref 96–112)
CO2: 23 mmol/L (ref 19–32)
Calcium: 7.4 mg/dL — ABNORMAL LOW (ref 8.4–10.5)
Creatinine, Ser: 0.58 mg/dL (ref 0.50–1.10)
Glucose, Bld: 136 mg/dL — ABNORMAL HIGH (ref 70–99)
POTASSIUM: 3.3 mmol/L — AB (ref 3.5–5.1)
SODIUM: 132 mmol/L — AB (ref 135–145)

## 2014-11-02 LAB — TROPONIN I: Troponin I: 0.09 ng/mL — ABNORMAL HIGH

## 2014-11-02 LAB — HIV ANTIBODY (ROUTINE TESTING W REFLEX): HIV SCREEN 4TH GENERATION: NONREACTIVE

## 2014-11-02 MED ORDER — KETOROLAC TROMETHAMINE 15 MG/ML IJ SOLN: 15.0000 mg | Freq: Four times a day (QID) | INTRAMUSCULAR | Status: DC | PRN

## 2014-11-02 MED ORDER — POTASSIUM CHLORIDE 10 MEQ/100ML IV SOLN
10.0000 meq | INTRAVENOUS | Status: AC
  Administered 2014-11-02 (×3): 10 meq via INTRAVENOUS
  Filled 2014-11-02 (×3): qty 100

## 2014-11-02 MED ORDER — TRAMADOL HCL 50 MG PO TABS: 50.0000 mg | ORAL_TABLET | Freq: Four times a day (QID) | ORAL | Status: DC | PRN

## 2014-11-02 MED ORDER — DIPHENHYDRAMINE HCL 50 MG/ML IJ SOLN: 12.5000 mg | Freq: Four times a day (QID) | INTRAMUSCULAR | Status: DC | PRN

## 2014-11-02 MED ORDER — ACETAMINOPHEN 500 MG PO TABS: 500.0000 mg | ORAL_TABLET | Freq: Four times a day (QID) | ORAL | Status: DC | PRN

## 2014-11-02 MED ORDER — ASPIRIN 81 MG PO CHEW: 324.0000 mg | CHEWABLE_TABLET | Freq: Every day | ORAL | Status: DC

## 2014-11-02 MED ORDER — ENOXAPARIN SODIUM 40 MG/0.4ML ~~LOC~~ SOLN
40.0000 mg | SUBCUTANEOUS | Status: DC
  Filled 2014-11-02 (×3): qty 0.4

## 2014-11-02 MED ORDER — SODIUM CHLORIDE 0.9 % IV SOLN: 3.0000 g | Freq: Four times a day (QID) | INTRAVENOUS | Status: DC

## 2014-11-02 MED ORDER — HEPARIN BOLUS VIA INFUSION
2000.0000 [IU] | Freq: Once | INTRAVENOUS | Status: AC
  Administered 2014-11-02: 2000 [IU] via INTRAVENOUS
  Filled 2014-11-02: qty 2000

## 2014-11-02 MED ORDER — POTASSIUM CHLORIDE CRYS ER 20 MEQ PO TBCR
40.0000 meq | EXTENDED_RELEASE_TABLET | Freq: Once | ORAL | Status: AC
  Administered 2014-11-02: 40 meq via ORAL
  Filled 2014-11-02: qty 2

## 2014-11-02 MED ORDER — ENOXAPARIN SODIUM 40 MG/0.4ML ~~LOC~~ SOLN
40.0000 mg | SUBCUTANEOUS | Status: DC
Start: 2014-11-02 — End: 2014-11-04

## 2014-11-02 NOTE — Progress Notes (Signed)
Called to patient's room 13:15 during cardiac ultra sound test. Pt c/o numbness in left arm . Potassium IV stopped stat. MD aware. Will repeat neuro exam and assess.

## 2014-11-02 NOTE — Progress Notes (Signed)
Subjective:    Ms. Rabel experienced fever overnight to 38.3C at 10pm, which has since resolved. She reports that she still has some trouble swallowing and feels the lump on the right side of her neck. She is not eating much, but this is related to disliking hospital food options. Pt is requesting to be transferred to [facility].  Interval Events: -- blood cultures came back positive for gram positive cocci in pairs and chains -- discontinued heparin this morning   Objective:    Vital Signs:   Temp:  [97.9 F (36.6 C)-101 F (38.3 C)] 99 F (37.2 C) (02/10 0744) Pulse Rate:  [62-105] 105 (02/10 0744) Resp:  [18-28] 28 (02/10 0744) BP: (113-136)/(75-102) 124/90 mmHg (02/10 0744) SpO2:  [98 %-100 %] 98 % (02/10 0744) Last BM Date: 11/01/14  24-hour weight change: Weight change: n/a  Intake/Output:   Intake/Output Summary (Last 24 hours) at 11/02/14 1151 Last data filed at 11/02/14 1100  Gross per 24 hour  Intake 874.42 ml  Output      0 ml  Net 874.42 ml      Physical Exam: General appearance: alert, cooperative, lying in bed NAD HEENT: Normocephalic, atraumatic. EOMI. Hearing grossly intact. exudates present on right tonsil Neck: mild right sided submandibular adenopathy; 1cm palpable node. Full range of motion Lungs: CTAB normal work of breathing Heart: RRR, S1, S2 normal, no m/r/g Abdomen: soft, tender to palpation in epigastric region at old incision site; bowel sounds normal Extremities: no edema Neurologic: A&Ox3, no focal abnormalities  Labs:  Basic Metabolic Panel:  Recent Labs Lab 11/01/14 0417 11/02/14 0009  NA 134* 132*  K 3.4* 3.3*  CL 107 104  CO2 19 23  GLUCOSE 228* 136*  BUN 11 8  CREATININE 0.66 0.58  CALCIUM 7.9* 7.4*  MG 2.2  --    CBC:  Recent Labs Lab 10/31/14 0915  11/02/14 0009  WBC 10.5  < > 20.5*  NEUTROABS 9.6*  --   --   HGB 18.3*  < > 10.1*  HCT 51.6*  < > 29.8*  MCV 82.0  < > 81.9  PLT 102*  < > 182    < > = values in this interval not displayed.  Cardiac Enzymes:  Recent Labs Lab 11/01/14 1650  TROPONINI 0.13*    Microbiology: Results for orders placed or performed during the hospital encounter of 10/31/14  Rapid strep screen     Status: None   Collection Time: 10/31/14  6:29 AM  Result Value Ref Range Status   Streptococcus, Group A Screen (Direct) NEGATIVE NEGATIVE Final    Comment: (NOTE) A Rapid Antigen test may result negative if the antigen level in the sample is below the detection level of this test. The FDA has not cleared this test as a stand-alone test therefore the rapid antigen negative result has reflexed to a Group A Strep culture.   Culture, Group A Strep     Status: None (Preliminary result)   Collection Time: 10/31/14  6:29 AM  Result Value Ref Range Status   Specimen Description THROAT  Final   Special Requests NONE  Final   Culture   Final    NO SUSPICIOUS COLONIES, CONTINUING TO HOLD Performed at [facility]    Report Status PENDING  Incomplete  Blood culture (routine x 2)     Status: None (Preliminary result)   Collection Time: 10/31/14 11:18 AM  Result Value Ref Range Status   Specimen Description BLOOD  RIGHT FOREARM  Final   Special Requests BOTTLES DRAWN AEROBIC AND ANAEROBIC 5CC  Final   Culture   Final    GRAM POSITIVE COCCI IN PAIRS AND CHAINS Note: Gram Stain Report Called to,Read Back By and Verified With: ELLA RN ON 2C AT 6834 BY CASTC Performed at [facility]    Report Status PENDING  Incomplete  Blood culture (routine x 2)     Status: None (Preliminary result)   Collection Time: 10/31/14 11:26 AM  Result Value Ref Range Status   Specimen Description BLOOD LEFT FOREARM  Final   Special Requests BOTTLES DRAWN AEROBIC AND ANAEROBIC 5CC  Final   Culture   Final    GRAM POSITIVE COCCI IN PAIRS AND CHAINS Performed at [facility]    Report Status PENDING  Incomplete  MRSA PCR Screening     Status: None    Collection Time: 10/31/14 11:27 PM  Result Value Ref Range Status   MRSA by PCR NEGATIVE NEGATIVE Final    Comment:        The GeneXpert MRSA Assay (FDA approved for NASAL specimens only), is one component of a comprehensive MRSA colonization surveillance program. It is not intended to diagnose MRSA infection nor to guide or monitor treatment for MRSA infections.     Imaging: No results found.    Medications:    Infusions:    Scheduled Medications: . ampicillin-sulbactam (UNASYN) IV  3 g Intravenous 4 times per day  . aspirin  324 mg Oral Daily  . potassium chloride  10 mEq Intravenous Q1 Hr x 3    PRN Medications: acetaminophen, diphenhydrAMINE, ketorolac, traMADol   Assessment/ Plan:    Pt is a 29 y.o. 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 Interventions at this time will be focused on antibiotic treatment, pain management, and cardiac workup.   Acute Pharyngitis/Lemierre syndrome: Pt presented with 2 days dysphagia and fevers as well as CT findings of IJ thrombus and parapharyngeal edema; diagnosis is Lemierre syndrome. White count increased to was 105 on admission. Increased to 23.8 yesterday, down to 20.5 today. Pt will require 2 weeks IV antibiotics (unasyn) followed by 2 weeks PO antibiotics. Blood culture showed gram positive cocci in pairs and chains. Consider obtaining TEE. -- continue Unasyn 3g q6H -- 15mg  Toradol q6H PRN for pain -- 50mg  tramadol q6H PRN for pain -- 500mg  acetaminophen q6H PRN for pain -- PICC line placement prior to discharge -- possible ENT consult if symptoms persist or adenopathy worsens  Elevated Troponin: After episode of chest tightness on 11/01/14, troponin 0.36. Per cardiology, elevated troponin likely 2/2 to acute underlying illness as she also  has had fever, elevated white count. Unlikely that this is pulmonary embolism given lack of upper extremity swelling, SOB, or tachycardia. EKG does not suggest acute coronary syndrome. Serial troponin went down to <0.03 then back up to 0.13.  -- continue 324mg  aspirin daily -- discontinue heparin -- obtain echo (at [facility]) for evaluation of possible pericardia effusion. Consider TEE given gram positive bacteremia  Hypokalemia: K+ 3.3 on admission. Given 1 dose 40mg  K-dur. K+ 3.4 on 11/01/14. Given another dose 40mg  K-dur. K+ 3.3 on 2/10. -- 66mEq KCl for 3 doses -- f/u BMet  Elevated hemoglobin: Hb 18.3 on admission, attributed to likely dehydration. Baseline ~13. Repeat CBC  showed 10.8 on 11/01/14, down to 10.1 today. Anemia Likely due to dilution. -- continue to monitor CBC  Right ovarian cyst: Pelvic US 04/23/2014 revealed probable right ovarian hemorrhagic cyst. Pt was recommended for 4-6 week follow-up. No evidence of follow-up noted in Epic. Currently asymptomatic.  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 -- f/u MRI in 6 months as outpatient   FEN/GI: -- regular diet  DVT PPx: -- Lovenox  Code Status: FULL CODE  Consults placed:  -- Cardiology  Dispo: Pt will be [facility] this afternoon.  The patient does have a current PCP (PCP Not in system - Dr. Doneta Public, need to confirm with pt). Will require OPC follow-up after discharge.   The patient does not have transportation limtations that hinder transportation to clinic appointments.   SERVICE NEEDED AT [place]         Y = Yes, Blank = No PT:   OT:   RN:   Equipment:   Other:     Length of Stay: 2 day(s)   Signed: Louisa Second, Med Student  Pager: [phone] (7AM-5PM) 11/02/2014, 11:51 AM

## 2014-11-02 NOTE — Progress Notes (Signed)
Repeat Neuro exam at 14:00 shows  No further numbness of left arm. Pt educated to report any return of numbness tingling in U.E. or face.

## 2014-11-02 NOTE — Progress Notes (Signed)
    Subjective: 8/10 chest tightness last night  Objective: Vital signs in last 24 hours: Temp:  [97.9 F (36.6 C)-101 F (38.3 C)] 99.9 F (37.7 C) (02/10 1202) Pulse Rate:  [62-105] 105 (02/10 0744) Resp:  [18-28] 28 (02/10 0744) BP: (113-136)/(75-102) 124/90 mmHg (02/10 0744) SpO2:  [98 %-100 %] 98 % (02/10 0744) Last BM Date: 11/01/14  Intake/Output from previous day: 02/09 0701 - 02/10 0700 In: 707.5 [P.O.:120; I.V.:187.5; IV Piggyback:400] Out: -  Intake/Output this shift: Total I/O In: 402.5 [P.O.:240; I.V.:62.5; IV Piggyback:100] Out: 0   Medications Current Facility-Administered Medications  Medication Dose Route Frequency Provider Last Rate Last Dose  . acetaminophen (TYLENOL) tablet 500 mg  500 mg Oral Q6H PRN Juluis Mire, MD   500 mg at 11/01/14 2104  . Ampicillin-Sulbactam (UNASYN) 3 g in sodium chloride 0.9 % 100 mL IVPB  3 g Intravenous 4 times per day Marjan Rabbani, MD 100 mL/hr at 11/02/14 1200 3 g at 11/02/14 1200  . aspirin chewable tablet 324 mg  324 mg Oral Daily Carly Rivet, MD   324 mg at 11/02/14 1000  . diphenhydrAMINE (BENADRYL) injection 12.5 mg  12.5 mg Intravenous Q6H PRN Marjan Rabbani, MD      . ketorolac (TORADOL) 15 MG/ML injection 15 mg  15 mg Intravenous Q6H PRN Tasrif Ahmed, MD   15 mg at 11/01/14 2309  . traMADol (ULTRAM) tablet 50 mg  50 mg Oral Q6H PRN Arman Filter, MD   50 mg at 11/01/14 1651    PE: General appearance: alert, cooperative and no distress Lungs: clear to auscultation bilaterally Heart: regular rate and rhythm, S1, S2 normal, no murmur, click, rub or gallop Extremities: No LEE Pulses: 2+ and symmetric Skin: Warm and dry Neurologic: Grossly normal  Lab Results:   Recent Labs  10/31/14 0915 11/01/14 0417 11/02/14 0009  WBC 10.5 23.8* 20.5*  HGB 18.3* 10.6* 10.1*  HCT 51.6* 31.3* 29.8*  PLT 102* 193 182   BMET  Recent Labs  10/31/14 0915 11/01/14 0417 11/02/14 0009  NA 132* 134* 132*  K 3.3*  3.4* 3.3*  CL 101 107 104  CO2 21 19 23   GLUCOSE 123* 228* 136*  BUN 8 11 8   CREATININE 0.86 0.66 0.58  CALCIUM 8.4 7.9* 7.4*      Assessment/Plan  29 year old with severe fever of 104, tonsillitis with reactive lymphadenopathy and associated right internal jugular partial thrombus with leukocytosis, transient chest pain currently resolved which resulted in troponin blood draw which was mildly elevated at 0.36.  Family history:  Dad with MI in 74's  Principal Problem:   Lemierre syndrome Active Problems:   Tobacco abuse   History of pancreatic disorder   Oral contraceptive use   Hypokalemia   Reactive airway disease   Right ovarian cyst   Chiari I malformation  Troponin: 0.36>>0.03>>0.13.  On IV heparin, ASA.  BP stable.  Echo being completed now.   Will check one more troponin since it went back up.  She reports continued chest tightness, 8/10 and after three NTG it started to ease off. With family history, stress imaging would be a good idea once she has recovered from the infection.  Replace K.   Blood cultures: gram positive cocci in pairs.     LOS: 2 days    Roman Dubuc PA-C 11/02/2014 1:03 PM

## 2014-11-02 NOTE — Progress Notes (Signed)
Subjective:    She reports some residual sore throat. She did have a fever last night, she reports that she felt warm at that time. She denies any chest pain or chest tightness currently. She is requesting transfer to the [facility] today.  Interval Events: -Troponin down to less than 0.03 on recheck, back up to 0.13 on repeat. -Potassium remains low after supplementation yesterday. -Blood culture growing gram-positive cocci in chains and pairs. -Fever to 101 last night responded to Tylenol.    Objective:    Vital Signs:   Temp:  [97.9 F (36.6 C)-101 F (38.3 C)] 99.9 F (37.7 C) (02/10 1202) Pulse Rate:  [62-105] 105 (02/10 0744) Resp:  [18-28] 28 (02/10 0744) BP: (113-136)/(75-102) 124/90 mmHg (02/10 0744) SpO2:  [98 %-100 %] 98 % (02/10 0744) Last BM Date: 11/01/14  24-hour weight change: Weight change:   Intake/Output:   Intake/Output Summary (Last 24 hours) at 11/02/14 1326 Last data filed at 11/02/14 1200  Gross per 24 hour  Intake 866.92 ml  Output      0 ml  Net 866.92 ml      Physical Exam: General: Vital signs reviewed and noted. Well-developed, well-nourished, in no acute distress; alert, appropriate and cooperative throughout examination. Continues to have neck lymphadenopathy and tenderness to palpation.   Lungs:  Normal respiratory effort. Clear to auscultation BL without crackles or wheezes.  Heart: Regular rate, regular rhythm. S1 and S2 normal without gallop, murmur, or rubs.  Abdomen:  BS normoactive. Soft, Nondistended, non-tender.  No masses or organomegaly.  Extremities: No pretibial edema.     Labs:  Basic Metabolic Panel:  Recent Labs Lab 10/31/14 0915 11/01/14 0417 11/02/14 0009  NA 132* 134* 132*  K 3.3* 3.4* 3.3*  CL 101 107 104  CO2 21 19 23   GLUCOSE 123* 228* 136*  BUN 8 11 8   CREATININE 0.86 0.66 0.58  CALCIUM 8.4 7.9* 7.4*  MG  --  2.2  --     Liver Function Tests:  Recent Labs Lab 11/01/14 0417    AST 18  ALT 14  ALKPHOS 76  BILITOT 0.3  PROT 6.3  ALBUMIN 2.8*   CBC:  Recent Labs Lab 10/31/14 0915 11/01/14 0417 11/02/14 0009  WBC 10.5 23.8* 20.5*  NEUTROABS 9.6*  --   --   HGB 18.3* 10.6* 10.1*  HCT 51.6* 31.3* 29.8*  MCV 82.0 82.6 81.9  PLT 102* 193 182    Cardiac Enzymes:  Recent Labs Lab 11/01/14 0417 11/01/14 1021 11/01/14 1650  TROPONINI 0.36* <0.03 0.13*    Microbiology: Results for orders placed or performed during the hospital encounter of 10/31/14  Rapid strep screen     Status: None   Collection Time: 10/31/14  6:29 AM  Result Value Ref Range Status   Streptococcus, Group A Screen (Direct) NEGATIVE NEGATIVE Final    Comment: (NOTE) A Rapid Antigen test may result negative if the antigen level in the sample is below the detection level of this test. The FDA has not cleared this test as a stand-alone test therefore the rapid antigen negative result has reflexed to a Group A Strep culture.   Culture, Group A Strep     Status: None (Preliminary result)   Collection Time: 10/31/14  6:29 AM  Result Value Ref Range Status   Specimen Description THROAT  Final   Special Requests NONE  Final   Culture   Final    NO SUSPICIOUS COLONIES, CONTINUING TO HOLD  Performed at [facility]    Report Status PENDING  Incomplete  Blood culture (routine x 2)     Status: None (Preliminary result)   Collection Time: 10/31/14 11:18 AM  Result Value Ref Range Status   Specimen Description BLOOD RIGHT FOREARM  Final   Special Requests BOTTLES DRAWN AEROBIC AND ANAEROBIC 5CC  Final   Culture   Final    GRAM POSITIVE COCCI IN PAIRS AND CHAINS Note: Gram Stain Report Called to,Read Back By and Verified With: ELLA RN ON 2C AT 3235 BY CASTC Performed at [facility]    Report Status PENDING  Incomplete  Blood culture (routine x 2)     Status: None (Preliminary result)   Collection Time: 10/31/14 11:26 AM  Result Value Ref Range Status   Specimen  Description BLOOD LEFT FOREARM  Final   Special Requests BOTTLES DRAWN AEROBIC AND ANAEROBIC 5CC  Final   Culture   Final    GRAM POSITIVE COCCI IN PAIRS AND CHAINS Performed at [facility]    Report Status PENDING  Incomplete  MRSA PCR Screening     Status: None   Collection Time: 10/31/14 11:27 PM  Result Value Ref Range Status   MRSA by PCR NEGATIVE NEGATIVE Final    Comment:        The GeneXpert MRSA Assay (FDA approved for NASAL specimens only), is one component of a comprehensive MRSA colonization surveillance program. It is not intended to diagnose MRSA infection nor to guide or monitor treatment for MRSA infections.     Imaging: Dg Chest 2 View  10/31/2014   CLINICAL DATA:  Right neck and chest pain.  Lump in the right neck.  EXAM: CHEST  2 VIEW  COMPARISON:  04/23/2014  FINDINGS: The airway plugging at the left lung apex is not readily visible on conventional radiography.  Airway thickening is present, suggesting bronchitis or reactive airways disease. No airspace opacity is identified.  Cardiac and mediastinal margins appear normal. No pleural effusion identified.  IMPRESSION: 1. Airway thickening is present, suggesting bronchitis or reactive airways disease.   Electronically Signed   By: Sherryl Barters M.D.   On: 10/31/2014 13:48       Medications:    Infusions:    Scheduled Medications: . ampicillin-sulbactam (UNASYN) IV  3 g Intravenous 4 times per day  . aspirin  324 mg Oral Daily    PRN Medications: acetaminophen, diphenhydrAMINE, ketorolac, traMADol   Assessment/ Plan:    Principal Problem:   Lemierre syndrome Active Problems:   Tobacco abuse   History of pancreatic disorder   Oral contraceptive use   Hypokalemia   Reactive airway disease   Right ovarian cyst   Chiari I malformation  #Acute pharyngitis with Lemierre's syndrome Continues to have throat pain that is controlled with her current pain regimen. She have a fever last night  but was less than prior, and responded Tylenol. [facility]. Could not swallow tramadol, so was written for Toradol when necessary. -Continue Unasyn IV, day 3. She will need to continue for 2 weeks prior to transitioning to oral antibiotics for an additional 2 weeks. Will need PICC prior to discharge. -Continue tramadol and Toradol when necessary for pain. -Follow-up throat and blood culture. -Regular diet. -We'll transfer to [place] today.  #Elevated troponins Cardiology thinks this is due to her infection, so we'll stop heparin. No need for anticoagulation for small thrombus in right IJ. Troponin was normal but  now back up again, so we will need follow-up. Chest pain has resolved. -Stop heparin. -Check 1 more troponin. -Follow-up echocardiogram. -Continue aspirin 324 mg daily.  #Anaphylaxis No further symptoms of anaphylaxis. -Benadryl every 6 hours as needed.  #Hypokalemia Potassium remains low. Etiology unclear. Unable to take potassium pill due to throat pain. Tried to give IV, but she developed arm numbness. Went to see, and no other neurological deficits. Resolved after stopping potassium. -Recheck BMP tomorrow.   DVT PPX - low molecular weight heparin and heparin  CODE STATUS - Full.  CONSULTS PLACED - Cardiology.  DISPO - Transfer to [facility] per patient request.  The patient does not have a current PCP (Pcp Not In System) and does need an [facility] hospital follow-up appointment after discharge.    Is the [facility] hospital follow-up appointment a one-time only appointment? yes.  Does the patient have transportation limitations that hinder transportation to clinic appointments? no   SERVICE NEEDED AT [facility]         Y = Yes, Blank = No PT:   OT:   RN:   Equipment:   Other:      Length of Stay: 2 day(s)   Signed: Arman Filter, MD  PGY-1, Internal Medicine Resident Pager: [phone]  (7AM-5PM) 11/02/2014, 1:26 PM

## 2014-11-02 NOTE — Progress Notes (Signed)
I have seen and examined the patient along with Tarri Fuller, PA.  I have reviewed the chart, notes and new data.  I agree with PA's note.  She is quite unhappy (not clear to me what she is unhappy about) and wants to leave. Her echo is completely normal and I do not think additional inpatient cardiac w/u is necessary. Once acute infection is over, reasonable to do a treadmill test. I do not think her cardiac enzyme abnormalities are due to either pericarditis or a coronary event.  Sanda Klein, MD, [place] [phone] 11/02/2014, 2:54 PM

## 2014-11-02 NOTE — Progress Notes (Signed)
  Echocardiogram 2D Echocardiogram has been performed.  [name] [name] [name] 11/02/2014, 1:54 PM

## 2014-11-02 NOTE — Discharge Instructions (Addendum)
·   Thank you for allowing [place] to be involved in your healthcare while you were hospitalized at [facility].   Please note that there have been changes to your home medications.  --> PLEASE LOOK AT YOUR DISCHARGE MEDICATION LIST FOR DETAILS.   Please call your PCP if you have any questions or concerns, or any difficulty getting any of your medications.  Please return to the ER if you have worsening of your symptoms or new severe symptoms arise.  You will need to continue antibiotics intravenously for 9 more days, followed by oral antibiotics for 2 additional weeks.  I wrote you a prescription for tramadol and lozenges for your pain.  You can also take over the counter Tylenol or ibuprofen to help with your sore throat.

## 2014-11-02 NOTE — Progress Notes (Signed)
ANTICOAGULATION CONSULT NOTE - Follow-up Consult  Pharmacy Consult for Heparin Indication: chest pain/ACS  Allergies  Allergen Reactions  . Contrast Media [Iodinated Diagnostic Agents] Anaphylaxis  . Strawberry Anaphylaxis  . Hydrocodone Hives    Can take acetaminophen    Patient Measurements: Height: 5\' 7"  (170.2 cm) Weight: 139 lb 5.3 oz (63.2 kg) IBW/kg (Calculated) : 61.6 Heparin Dosing Weight: 63.2 kg  Vital Signs: Temp: 98.5 F (36.9 C) (02/09 2302) Temp Source: Oral (02/09 2302) BP: 113/75 mmHg (02/09 2302) Pulse Rate: 79 (02/09 2302)  Labs:  Recent Labs  10/31/14 0915 11/01/14 0417 11/01/14 1021 11/01/14 1650 11/02/14 0009  HGB 18.3* 10.6*  --   --  10.1*  HCT 51.6* 31.3*  --   --  29.8*  PLT 102* 193  --   --  182  HEPARINUNFRC  --   --   --  <0.10* <0.10*  CREATININE 0.86 0.66  --   --  0.58  TROPONINI  --  0.36* <0.03 0.13*  --     Estimated Creatinine Clearance: 101.8 mL/min (by C-G formula based on Cr of 0.58).  Assessment: 29 yo F on heparin for r/o ACS. Heparin level remains undetectable on 1000 units/hr. No bleeding noted. No issues with line per RN. H/H and Plt remain stable.   Goal of Therapy:  Heparin level 0.3-0.7 units/ml Monitor platelets by anticoagulation protocol: Yes   Plan:  Rebolus heparin 2000 units Increase heparin infusion to 1250 units/hr Heparin level in 6 hours  Albertina Parr, PharmD., BCPS Clinical Pharmacist Pager [phone]

## 2014-11-02 NOTE — Discharge Summary (Addendum)
Name: [name] [name] MRN: [id number] DOB: Feb 25, 1986 29 y.o. PCP: Pcp Not In System  Date of Admission: 10/31/2014  5:28 AM Date of Discharge: 11/04/2014 Attending Physician: Bartholomew Crews, MD  Discharge Diagnosis:  Principal Problem:   Lemierre syndrome Active Problems:   Tobacco abuse   Reactive airway disease   Chiari I malformation   Group C streptococcal infection   Hypokalemia  Discharge Medications:   Medication List    STOP taking these medications        ALKA SELTZER PLUS PO      TAKE these medications        Ampicillin-Sulbactam 3 g in sodium chloride 0.9 % 100 mL  Inject 3 g into the vein every 6 (six) hours.     menthol-cetylpyridinium 3 MG lozenge  Commonly known as:  CEPACOL  Take 1 lozenge (3 mg total) by mouth as needed for sore throat.     norethindrone-ethinyl estradiol-iron 1.5-30 MG-MCG tablet  Commonly known as:  MICROGESTIN FE,GILDESS FE,LOESTRIN FE  Take 1 tablet by mouth daily.     traMADol 50 MG tablet  Commonly known as:  ULTRAM  Take 1 tablet (50 mg total) by mouth every 6 (six) hours as needed for moderate pain.        Disposition and follow-up:   [name] [name] was discharged from [facility] in Stable condition.  At the hospital follow up visit please address:  1.  Will need IV antibiotics for 9 more days, ending on 11/13/14. D/C PICC line at that time.  She will need to continue on oral antibiotics for an additional 2 weeks at that time for treatment of Lemierre's syndrome.  2.  Labs / imaging needed at time of follow-up: BMP to check potassium level.  3.  Pending labs/ test needing follow-up: Repeat blood cultures, final throat cultures.  Follow-up Appointments: Follow-up Information    Follow up with [facility] In 1 week.   Specialty:  General Practice   Why:  Will call you with an appointment.   Contact information:   [place] [id number] [phone]        Discharge Instructions: Discharge Instructions    Diet - low sodium heart healthy    Complete by:  As directed      Increase activity slowly    Complete by:  As directed            Thank you for allowing [place] to be involved in your healthcare while you were hospitalized at [facility].   Please note that there have been changes to your home medications.  --> PLEASE LOOK AT YOUR DISCHARGE MEDICATION LIST FOR DETAILS.   Please call your PCP if you have any questions or concerns, or any difficulty getting any of your medications.  Please return to the ER if you have worsening of your symptoms or new severe symptoms arise.  You will need to continue antibiotics intravenously for 2 weeks followed by oral antibiotics for an additional 2 weeks.  Your cardiac enzymes will need to be continued to be followed at the [facility].  Consultations:  Cardiology  Procedures Performed:  Dg Chest 2 View  10/31/2014   CLINICAL DATA:  Right neck and chest pain.  Lump in the right neck.  EXAM: CHEST  2 VIEW  COMPARISON:  04/23/2014  FINDINGS: The airway plugging at the left lung apex is not readily visible on conventional radiography.  Airway thickening is  present, suggesting bronchitis or reactive airways disease. No airspace opacity is identified.  Cardiac and mediastinal margins appear normal. No pleural effusion identified.  IMPRESSION: 1. Airway thickening is present, suggesting bronchitis or reactive airways disease.   Electronically Signed   By: Sherryl Barters M.D.   On: 10/31/2014 13:48   Ct Soft Tissue Neck W Contrast  10/31/2014   CLINICAL DATA:  Sore throat, mild cough, and knot on the right side of neck - onset 10/29/2014  EXAM: CT NECK WITH CONTRAST  TECHNIQUE: Multidetector CT imaging of the neck was performed using the standard protocol following the bolus administration of intravenous contrast.  CONTRAST:  73mL OMNIPAQUE IOHEXOL 300 MG/ML  SOLN  COMPARISON:  None.   FINDINGS: Pharynx and larynx: There is fat edema surrounding the pharynx, with mild enlargement of the tonsils, asymmetric to the right (where there is also likely pus in the tonsillar crypts). Reactive cervical lymphadenopathy without suppurative change. In the region of maximal inflammation in the right neck, there is an eccentric, well-circumscribed contrast filling defect most consistent with early clot. This appearance is not typical for a mixing/flow artifact or valve. No retropharyngeal abscess or phlegmon.  Salivary glands: No primary inflammation  Thyroid: Negative  Lymph nodes: Reactive enlargement, as above.  Vascular: Right IJ partial thrombosis, as above  Limited intracranial: Low cerebellar tonsils without foramen magnum 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  Visualized orbits: Negative  Mastoids and visualized paranasal sinuses: Negative  Skeleton: Negative  Upper chest: A subsegmental bronchus in the left upper lobe is opacified, indistinct, and mildly expanded. No other bronchiectatic airways.  IMPRESSION: 1. Tonsillitis/pharyngitis with parapharyngeal edema and adenopathy. Small thrombus in the right IJ, neighboring the maximal inflammation. 2. 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 The appearance is not typical of septic embolus related to #1.   Electronically Signed   By: Jorje Guild M.D.   On: 10/31/2014 10:25    2D Echo:  Study Conclusions  - Left ventricle: The cavity size was normal. Systolic function was normal. The estimated ejection fraction was in the range of 55% to 60%. Wall motion was normal; there were no regional wall motion abnormalities. Left ventricular diastolic function parameters were normal.  Cardiac Cath: None.  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 She reports having a sore throat for the past two days that she notices when she swallows. It is very tender to the touch and has been getting worse and is associated with otalgia. She also reports a knot on the outside of her neck on the right side but not on the left that she noticed at the time her sore throat started. She thinks it has been getting bigger, and she says her neck feels like she slept on it the wrong way. She denies fevers, cough, difficulty breathing, or chest pain, but she does endorse some light-headedness and chills. She has not looked in the back of her throat, but she denies any drainage, congestion, or face pain. She denies any sick contacts. She has tried warm compresses with some relief of her neck symptoms.   She was noted to have a fever to 104.4 in the ER along with swelling of her tonsils bilaterally. She was given ibuprofen and Fentanyl to  help with her pain. She underwent a CT neck with contrast that demonstrated R>L tonsilitis, pharyngitis and lymphadenopathy with a small thrombus in the R IJ, neighboring the maximal inflammation. Following administration of the contrast, she developed some itching and urticaria on her face and chest and diarrhea. She received IV solumedrol, epinephrine, and Benadryl with resolution.  Hospital Course by problem list: Principal Problem:   Lemierre syndrome Active Problems:   Tobacco abuse   Reactive airway disease   Chiari I malformation   Group C streptococcal infection   Hypokalemia   #Lemierre's syndrome with group C strep bacteremia [name] [name] presented with throat pain, and she noted swelling and tenderness of her right neck. On exam, she had swelling of her tonsils bilaterally with white exudate. CT neck showed right greater than left pharyngitis and associated lymphadenopathy  with a small thrombus in the right internal jugular vein consistent with Lemierre's syndrome. Her blood cultures grew group C Streptococcus sensitive to penicillin, and her throat culture began to grow beta-hemolytic streptococcus prior to discharge. Lemierre's syndrome requires intravenous antibiotics for at least 2 weeks followed by oral antibiotics to complete a course of treatment of 4 weeks. She was started on IV Unasyn, which was continued for 5 days prior to discharge. She will need to continue IV Unasyn for another 9 days, ending on 11/13/2014. Repeat blood cultures were drawn with no growth today prior to discharge. A PICC line was placed prior to discharge and a home health nurse was arranged to help her with administration of IV antibiotics. Her case was discussed with infectious disease who initially recommended a transesophageal echocardiogram. However, discussion with cardiology and infectious disease determined that her transthoracic echocardiogram was sufficient to exclude endocarditis. No additional TEE is recommended. Anticoagulation is controversial in Lemierre's syndrome, but not typically given unless there is an extensive clot. Given the small size of her clot, we decided not to anticoagulate her.  An area of opacification was seen on her CT neck, which appeared to be bronchitis or reactive air disease on x-ray.  #Elevated troponin On her second night of admission she complained of some chest tightness. EKG was performed and was normal, but premature ventricular beats were observed on telemetry. She was noted to have elevated troponin of 0.36, and she was started on heparin and aspirin 325 mg daily. Cardiology was consulted and felt the troponin elevation was most likely due to her infection and recommended an echocardiogram, which was normal. Her troponin trended down, and heparin and aspirin were stopped prior to discharge.  #Hypokalemia She is noted to have hypokalemia on admission,  which was repleted orally or intravenously when possible. Her magnesium was normal. She did have some diarrhea following contrast administration, which could've contributed. Please obtain a repeat BMP as an outpatient to determine if additional supplementation or workup as necessary.  #Anaphylaxis due to contrast dye She developed urticaria and diarrhea following administration of intravenous contrast for her CT neck. This resolved with administration of Solu-Medrol, epinephrine, and Benadryl.  Discharge Vitals:   BP 135/91 mmHg  Pulse 92  Temp(Src) 97.5 F (36.4 C) (Oral)  Resp 18  Ht 5\' 7"  (1.702 m)  Wt 139 lb (63.05 kg)  BMI 21.77 kg/m2  SpO2 97%  LMP 10/30/2014  Discharge Labs:  Results for orders placed or performed during the hospital encounter of 10/31/14 (from the past 24 hour(s))  CBC     Status: Abnormal   Collection Time: 11/04/14  5:45 AM  Result Value Ref  Range   WBC 12.8 (H) 4.0 - 10.5 K/uL   RBC 3.91 3.87 - 5.11 MIL/uL   Hemoglobin 10.7 (L) 12.0 - 15.0 g/dL   HCT 31.8 (L) 36.0 - 46.0 %   MCV 81.3 78.0 - 100.0 fL   MCH 27.4 26.0 - 34.0 pg   MCHC 33.6 30.0 - 36.0 g/dL   RDW 14.9 11.5 - 15.5 %   Platelets 274 150 - 400 K/uL  Basic metabolic panel     Status: Abnormal   Collection Time: 11/04/14  5:45 AM  Result Value Ref Range   Sodium 135 135 - 145 mmol/L   Potassium 3.5 3.5 - 5.1 mmol/L   Chloride 104 96 - 112 mmol/L   CO2 22 19 - 32 mmol/L   Glucose, Bld 106 (H) 70 - 99 mg/dL   BUN 6 6 - 23 mg/dL   Creatinine, Ser 0.69 0.50 - 1.10 mg/dL   Calcium 8.0 (L) 8.4 - 10.5 mg/dL   GFR calc non Af Amer >90 >90 mL/min   GFR calc Af Amer >90 >90 mL/min   Anion gap 9 5 - 15    Signed: Arman Filter, MD 11/04/2014, 2:46 PM    Services Ordered on Discharge: Home health RN.  Equipment Ordered on Discharge: None.

## 2014-11-03 ENCOUNTER — Encounter (HOSPITAL_COMMUNITY): Payer: Self-pay | Admitting: Student

## 2014-11-03 DIAGNOSIS — R740 Nonspecific elevation of levels of transaminase and lactic acid dehydrogenase [LDH]: Secondary | ICD-10-CM

## 2014-11-03 DIAGNOSIS — A491 Streptococcal infection, unspecified site: Secondary | ICD-10-CM

## 2014-11-03 LAB — CULTURE, BLOOD (ROUTINE X 2)

## 2014-11-03 LAB — CBC
HCT: 30.3 % — ABNORMAL LOW (ref 36.0–46.0)
Hemoglobin: 10.5 g/dL — ABNORMAL LOW (ref 12.0–15.0)
MCH: 27.6 pg (ref 26.0–34.0)
MCHC: 34.7 g/dL (ref 30.0–36.0)
MCV: 79.7 fL (ref 78.0–100.0)
PLATELETS: 234 10*3/uL (ref 150–400)
RBC: 3.8 MIL/uL — ABNORMAL LOW (ref 3.87–5.11)
RDW: 15 % (ref 11.5–15.5)
WBC: 11.7 10*3/uL — ABNORMAL HIGH (ref 4.0–10.5)

## 2014-11-03 LAB — BASIC METABOLIC PANEL
ANION GAP: 9 (ref 5–15)
BUN: 8 mg/dL (ref 6–23)
CO2: 23 mmol/L (ref 19–32)
Calcium: 7.5 mg/dL — ABNORMAL LOW (ref 8.4–10.5)
Chloride: 104 mmol/L (ref 96–112)
Creatinine, Ser: 0.69 mg/dL (ref 0.50–1.10)
GFR calc Af Amer: >90 mL/min (ref >90)
GLUCOSE: 70 mg/dL (ref 70–99)
POTASSIUM: 3.1 mmol/L — AB (ref 3.5–5.1)
Sodium: 136 mmol/L (ref 135–145)

## 2014-11-03 LAB — CULTURE, GROUP A STREP

## 2014-11-03 MED ORDER — POTASSIUM CHLORIDE CRYS ER 20 MEQ PO TBCR
40.0000 meq | EXTENDED_RELEASE_TABLET | Freq: Once | ORAL | Status: AC
  Administered 2014-11-03: 40 meq via ORAL
  Filled 2014-11-03: qty 2

## 2014-11-03 MED ORDER — SODIUM CHLORIDE 0.9 % IV SOLN: 3.0000 g | Freq: Four times a day (QID) | INTRAVENOUS | Status: AC

## 2014-11-03 MED ORDER — MENTHOL 3 MG MT LOZG
1.0000 | LOZENGE | OROMUCOSAL | Status: DC | PRN
  Administered 2014-11-03: 3 mg via ORAL
  Filled 2014-11-03: qty 9

## 2014-11-03 MED ORDER — POTASSIUM CHLORIDE 20 MEQ PO PACK: 40.0000 meq | PACK | Freq: Once | ORAL | Status: DC

## 2014-11-03 NOTE — Progress Notes (Signed)
    Subjective:  Awake and alert. She says her throat is still sore but she is tolerating a regular diet.   Objective:  Vital Signs in the last 24 hours: Temp:  [99 F (37.2 C)-100.2 F (37.9 C)] 99.5 F (37.5 C) (02/11 0546) Pulse Rate:  [62-81] 66 (02/11 0546) Resp:  [18-20] 18 (02/11 0546) BP: (115-129)/(72-90) 120/75 mmHg (02/11 0546) SpO2:  [98 %-100 %] 98 % (02/11 0546) Weight:  [136 lb 14.4 oz (62.097 kg)] 136 lb 14.4 oz (62.097 kg) (02/11 0546)  Intake/Output from previous day:  Intake/Output Summary (Last 24 hours) at 11/03/14 1138 Last data filed at 11/03/14 1000  Gross per 24 hour  Intake   1040 ml  Output   1100 ml  Net    -60 ml    Physical Exam: General appearance: alert, cooperative and no distress Lungs: clear to auscultation bilaterally Heart: regular rate and rhythm   Rate: 68  Rhythm: not on telemetry  Lab Results:  Recent Labs  11/02/14 0009 11/03/14 0819  WBC 20.5* 11.7*  HGB 10.1* 10.5*  PLT 182 234    Recent Labs  11/02/14 0009 11/03/14 0819  NA 132* 136  K 3.3* 3.1*  CL 104 104  CO2 23 23  GLUCOSE 136* 70  BUN 8 8  CREATININE 0.58 0.69    Recent Labs  11/01/14 1650 11/02/14 1441  TROPONINI 0.13* 0.09*   No results for input(s): INR in the last 72 hours.  Imaging: Imaging results have been reviewed  Cardiac Studies:  TTE 11/02/14 Study Conclusions  - Left ventricle: The cavity size was normal. Systolic function was normal. The estimated ejection fraction was in the range of 55% to 60%. Wall motion was normal; there were no regional wall motion abnormalities. Left ventricular diastolic function parameters were normal.  Assessment/Plan:  29 y/o admitted with fever and group C streptococci bacteremia and pharyngitis with Lemierre's syndrome. She has an elevated Troponin cardiology consulted.   Principal Problem:   Lemierre syndrome Active Problems:   Group C streptococcal infection   Tobacco abuse  Reactive airway disease   Hypokalemia   Chiari I malformation   PLAN: Will review with MD, not sure she can have a TEE at this time with parapharyngeal edema, adenopathy, and small thrombus in the right IJ seen on CT. K+ ordered  Kerin Ransom PA-C Beeper [phone] 11/03/2014, 11:38 AM   Discussed with Dr Sallyanne Kuster. No TEE at this time.  I have seen and examined the patient along with Kerin Ransom PA-C.  I have reviewed the chart, notes and new data.  I agree with PA/NP's note.  PLAN: Reviewed the data with Dr. Tommy Medal. She actually had very good quality images on 2D echo, without suggestion of endocarditis. The prompt response to therapy is also reassuring. I would think the risk of pharyngeal injury exceeds the yield of TEE. He agrees we can forego the study  Sanda Klein, MD, [facility] [place] [phone] 11/03/2014, 12:49 PM

## 2014-11-03 NOTE — Progress Notes (Signed)
Subjective:    She reports feeling better this morning after being transferred to the floor, and was very pleasant this morning. She is okay with staying for a couple of days to finish her workup. She does continue to have some throat pain and tenderness.  Interval Events: -Troponin back down to 0.09 on recheck. -Blood cultures growing Streptococcus group C.  -Repeat blood cultures drawn. -No additional fevers.   Objective:    Vital Signs:   Temp:  [99 F (37.2 C)-100.2 F (37.9 C)] 99.5 F (37.5 C) (02/11 0546) Pulse Rate:  [62-81] 66 (02/11 0546) Resp:  [18-20] 18 (02/11 0546) BP: (115-129)/(72-90) 120/75 mmHg (02/11 0546) SpO2:  [98 %-100 %] 98 % (02/11 0546) Weight:  [136 lb 14.4 oz (62.097 kg)] 136 lb 14.4 oz (62.097 kg) (02/11 0546) Last BM Date: 11/02/14  24-hour weight change: Weight change:   Intake/Output:   Intake/Output Summary (Last 24 hours) at 11/03/14 0951 Last data filed at 11/03/14 0630  Gross per 24 hour  Intake  742.5 ml  Output   1100 ml  Net -357.5 ml      Physical Exam: General: Vital signs reviewed and noted. Well-developed, well-nourished, in no acute distress; alert, appropriate and cooperative throughout examination. Neck lymphadenopathy and tenderness to palpation.   Lungs:  Normal respiratory effort. Clear to auscultation BL without crackles or wheezes.  Heart: Regular rate, regular rhythm. S1 and S2 normal without gallop, murmur, or rubs.  Abdomen:  BS normoactive. Soft, Nondistended, non-tender.  No masses or organomegaly.  Extremities: No pretibial edema.     Labs:  Basic Metabolic Panel:  Recent Labs Lab 10/31/14 0915 11/01/14 0417 11/02/14 0009  NA 132* 134* 132*  K 3.3* 3.4* 3.3*  CL 101 107 104  CO2 21 19 23   GLUCOSE 123* 228* 136*  BUN 8 11 8   CREATININE 0.86 0.66 0.58  CALCIUM 8.4 7.9* 7.4*  MG  --  2.2  --     Liver Function Tests:  Recent Labs Lab 11/01/14 0417  AST 18  ALT 14  ALKPHOS 76  BILITOT  0.3  PROT 6.3  ALBUMIN 2.8*   CBC:  Recent Labs Lab 10/31/14 0915 11/01/14 0417 11/02/14 0009 11/03/14 0819  WBC 10.5 23.8* 20.5* 11.7*  NEUTROABS 9.6*  --   --   --   HGB 18.3* 10.6* 10.1* 10.5*  HCT 51.6* 31.3* 29.8* 30.3*  MCV 82.0 82.6 81.9 79.7  PLT 102* 193 182 234    Cardiac Enzymes:  Recent Labs Lab 11/01/14 0417 11/01/14 1021 11/01/14 1650 11/02/14 1441  TROPONINI 0.36* <0.03 0.13* 0.09*    Microbiology: Results for orders placed or performed during the hospital encounter of 10/31/14  Rapid strep screen     Status: None   Collection Time: 10/31/14  6:29 AM  Result Value Ref Range Status   Streptococcus, Group A Screen (Direct) NEGATIVE NEGATIVE Final    Comment: (NOTE) A Rapid Antigen test may result negative if the antigen level in the sample is below the detection level of this test. The FDA has not cleared this test as a stand-alone test therefore the rapid antigen negative result has reflexed to a Group A Strep culture.   Culture, Group A Strep     Status: None (Preliminary result)   Collection Time: 10/31/14  6:29 AM  Result Value Ref Range Status   Specimen Description THROAT  Final   Special Requests NONE  Final   Culture   Final  NO SUSPICIOUS COLONIES, CONTINUING TO HOLD Performed at [facility]    Report Status PENDING  Incomplete  Blood culture (routine x 2)     Status: None (Preliminary result)   Collection Time: 10/31/14 11:18 AM  Result Value Ref Range Status   Specimen Description BLOOD RIGHT FOREARM  Final   Special Requests BOTTLES DRAWN AEROBIC AND ANAEROBIC 5CC  Final   Culture   Final    STREPTOCOCCUS GROUP C Note: Gram Stain Report Called to,Read Back By and Verified With: ELLA RN ON 2C AT 8119 BY CASTC Performed at [facility]    Report Status PENDING  Incomplete  Blood culture (routine x 2)     Status: None (Preliminary result)   Collection Time: 10/31/14 11:26 AM  Result Value Ref Range Status    Specimen Description BLOOD LEFT FOREARM  Final   Special Requests BOTTLES DRAWN AEROBIC AND ANAEROBIC 5CC  Final   Culture   Final    GRAM POSITIVE COCCI IN PAIRS AND CHAINS Performed at [facility]    Report Status PENDING  Incomplete  MRSA PCR Screening     Status: None   Collection Time: 10/31/14 11:27 PM  Result Value Ref Range Status   MRSA by PCR NEGATIVE NEGATIVE Final    Comment:        The GeneXpert MRSA Assay (FDA approved for NASAL specimens only), is one component of a comprehensive MRSA colonization surveillance program. It is not intended to diagnose MRSA infection nor to guide or monitor treatment for MRSA infections.   Culture, blood (routine x 2)     Status: None (Preliminary result)   Collection Time: 11/02/14  7:25 PM  Result Value Ref Range Status   Specimen Description BLOOD RIGHT FOREARM  Final   Special Requests   Final    BOTTLES DRAWN AEROBIC AND ANAEROBIC 10CC AER,7CC ANA [place]   Culture PENDING  Incomplete   Report Status PENDING  Incomplete  Culture, blood (routine x 2)     Status: None (Preliminary result)   Collection Time: 11/02/14  7:30 PM  Result Value Ref Range Status   Specimen Description BLOOD RIGHT HAND  Final   Special Requests BOTTLES DRAWN AEROBIC ONLY 10CC  Final   Culture PENDING  Incomplete   Report Status PENDING  Incomplete    Imaging: No results found.     Medications:    Infusions:    Scheduled Medications: . ampicillin-sulbactam (UNASYN) IV  3 g Intravenous 4 times per day  . aspirin  324 mg Oral Daily  . enoxaparin (LOVENOX) injection  40 mg Subcutaneous Q24H    PRN Medications: acetaminophen, diphenhydrAMINE, ketorolac, traMADol   Assessment/ Plan:    Principal Problem:   Lemierre syndrome Active Problems:   Tobacco abuse   History of pancreatic disorder   Oral contraceptive use   Reactive airway disease   Right ovarian cyst   Chiari I malformation   Group C streptococcal infection    Hypokalemia  #Acute pharyngitis with Lemierre's syndrome Blood cultures growing group C streptococci, which is associated with pharyngitis. Discussed with Dr. Drucilla Schmidt of infectious disease who recommended doing a transesophageal echocardiogram given the high incidence of endocarditis with this type of bacteremia. He also recommended not placing PICC until repeat blood cultures were negative. Per the case manager, her outpatient antibiotics will be arranged through the [place]. -Continue Unasyn IV, day 4. She will need to continue for 2 weeks prior to transitioning to oral antibiotics for  an additional 2 weeks. -Repeat blood cultures drawn yesterday evening. We will follow-up results prior to placing PICC. -Continue tramadol and Toradol when necessary for pain. -Follow-up throat and final blood culture. -TEE scheduled for tomorrow. -Regular diet. Nothing by mouth after midnight.  #Hypokalemia BMP not returned this morning. -Follow-up BMP and supplement potassium as needed.  #Elevated troponins Troponin trended down again. Likely due to her bacteremia. TTE normal. -Continue aspirin 324 mg daily. We'll consider discontinuing.  #Anaphylaxis due to contrast media -Benadryl every 6 hours as needed.   DVT PPX - low molecular weight heparin and heparin  CODE STATUS - Full.  CONSULTS PLACED - Cardiology.  DISPO - possible discharge tomorrow after TEE and PICC placement.  The patient does not have a current PCP (Pcp Not In System) and does need an [facility] hospital follow-up appointment after discharge.    Is the [facility] hospital follow-up appointment a one-time only appointment? yes.  Does the patient have transportation limitations that hinder transportation to clinic appointments? no   SERVICE NEEDED AT [place]         Y = Yes, Blank = No PT:   OT:   RN:   Equipment:   Other:      Length of Stay: 3 day(s)   Signed: Arman Filter, MD  PGY-1,  Internal Medicine Resident Pager: [phone] (7AM-5PM) 11/03/2014, 9:51 AM

## 2014-11-04 ENCOUNTER — Encounter (HOSPITAL_COMMUNITY)
Admission: EM | Disposition: A | Discharge status: Home / Self Care | Payer: Self-pay | Source: Home / Self Care | Attending: Internal Medicine

## 2014-11-04 DIAGNOSIS — B954 Other streptococcus as the cause of diseases classified elsewhere: Secondary | ICD-10-CM

## 2014-11-04 DIAGNOSIS — R7881 Bacteremia: Secondary | ICD-10-CM

## 2014-11-04 LAB — BASIC METABOLIC PANEL
ANION GAP: 9 (ref 5–15)
BUN: 6 mg/dL (ref 6–23)
CALCIUM: 8 mg/dL — AB (ref 8.4–10.5)
CO2: 22 mmol/L (ref 19–32)
Chloride: 104 mmol/L (ref 96–112)
Creatinine, Ser: 0.69 mg/dL (ref 0.50–1.10)
GFR calc Af Amer: >90 mL/min (ref >90)
Glucose, Bld: 106 mg/dL — ABNORMAL HIGH (ref 70–99)
Potassium: 3.5 mmol/L (ref 3.5–5.1)
Sodium: 135 mmol/L (ref 135–145)

## 2014-11-04 LAB — HEMOGLOBIN A1C
Hgb A1c MFr Bld: 5.6 % (ref 4.8–5.6)
Mean Plasma Glucose: 114 mg/dL

## 2014-11-04 LAB — CBC
HCT: 31.8 % — ABNORMAL LOW (ref 36.0–46.0)
Hemoglobin: 10.7 g/dL — ABNORMAL LOW (ref 12.0–15.0)
MCH: 27.4 pg (ref 26.0–34.0)
MCHC: 33.6 g/dL (ref 30.0–36.0)
MCV: 81.3 fL (ref 78.0–100.0)
PLATELETS: 274 10*3/uL (ref 150–400)
RBC: 3.91 MIL/uL (ref 3.87–5.11)
RDW: 14.9 % (ref 11.5–15.5)
WBC: 12.8 10*3/uL — ABNORMAL HIGH (ref 4.0–10.5)

## 2014-11-04 SURGERY — ECHOCARDIOGRAM, TRANSESOPHAGEAL
Anesthesia: Moderate Sedation

## 2014-11-04 MED ORDER — MENTHOL 3 MG MT LOZG: 1.0000 | LOZENGE | OROMUCOSAL | Status: DC | PRN

## 2014-11-04 MED ORDER — SODIUM CHLORIDE 0.9 % IJ SOLN: 10.0000 mL | Freq: Two times a day (BID) | INTRAMUSCULAR | Status: DC

## 2014-11-04 MED ORDER — HEPARIN SOD (PORK) LOCK FLUSH 100 UNIT/ML IV SOLN
250.0000 [IU] | INTRAVENOUS | Status: AC | PRN
  Administered 2014-11-04: 250 [IU]

## 2014-11-04 MED ORDER — TRAMADOL HCL 50 MG PO TABS: 50.0000 mg | ORAL_TABLET | Freq: Four times a day (QID) | ORAL | Status: AC | PRN

## 2014-11-04 MED ORDER — SODIUM CHLORIDE 0.9 % IJ SOLN: 10.0000 mL | INTRAMUSCULAR | Status: DC | PRN

## 2014-11-04 NOTE — Progress Notes (Signed)
Patient is in her room not sign  Or symptoms of distress.

## 2014-11-04 NOTE — Progress Notes (Signed)
Patient went the floor per md order

## 2014-11-04 NOTE — Progress Notes (Signed)
Peripherally Inserted Central Catheter/Midline Placement  The IV Nurse has discussed with the patient and/or persons authorized to consent for the patient, the purpose of this procedure and the potential benefits and risks involved with this procedure.  The benefits include less needle sticks, lab draws from the catheter and patient may be discharged home with the catheter.  Risks include, but not limited to, infection, bleeding, blood clot (thrombus formation), and puncture of an artery; nerve damage and irregular heat beat.  Alternatives to this procedure were also discussed.  PICC/Midline Placement Documentation  PICC / Midline Single Lumen 65/53/74 PICC Right Basilic 37 cm 0 cm (Active)  Indication for Insertion or Continuance of Line Home intravenous therapies (PICC only) 11/04/2014 12:00 PM  Exposed Catheter (cm) 0 cm 11/04/2014 12:00 PM  Site Assessment Clean;Dry;Intact 11/04/2014 12:00 PM  Line Status Flushed;Saline locked;Blood return noted 11/04/2014 12:00 PM  Dressing Type Transparent 11/04/2014 12:00 PM  Dressing Change Due 11/11/14 11/04/2014 12:00 PM       [place] Payment 11/04/2014, 12:13 PM

## 2014-11-04 NOTE — Progress Notes (Signed)
Subjective:    She is doing well this morning with only mild throat pain that she says is responding to her lozenges and oral pain medications. She is excited to go home and is requesting a school note.  Interval Events: -Cardiology discussed with infectious disease who did not feel that a TEE was necessary given the good quality of her TTE and lack of valvular disease. -Repeat blood culture with no growth to date. -Throat culture growing beta hemolytic strep. -Afebrile vital signs stable.   Objective:    Vital Signs:   Temp:  [98.9 F (37.2 C)-99.4 F (37.4 C)] 98.9 F (37.2 C) (02/12 0500) Pulse Rate:  [84-87] 84 (02/12 0500) Resp:  [18] 18 (02/12 0500) BP: (122-132)/(68-79) 124/68 mmHg (02/12 0500) SpO2:  [100 %] 100 % (02/12 0500) Weight:  [139 lb (63.05 kg)] 139 lb (63.05 kg) (02/12 0500) Last BM Date: 11/03/14  24-hour weight change: Weight change: 2 lb 1.6 oz (0.953 kg)  Intake/Output:   Intake/Output Summary (Last 24 hours) at 11/04/14 1324 Last data filed at 11/04/14 1017  Gross per 24 hour  Intake    240 ml  Output      0 ml  Net    240 ml      Physical Exam: General: Well-developed, well-nourished, in no acute distress; alert, appropriate and cooperative throughout examination. Neck lymphadenopathy and tenderness to palpation.   Lungs:  Normal respiratory effort. Clear to auscultation BL without crackles or wheezes.  Heart: Regular rate, regular rhythm. S1 and S2 normal without gallop, murmur, or rubs.  Abdomen:  BS normoactive. Soft, Nondistended, non-tender.  No masses or organomegaly.  Extremities: No pretibial edema.     Labs:  Basic Metabolic Panel:  Recent Labs Lab 10/31/14 0915 11/01/14 0417 11/02/14 0009 11/03/14 0819 11/04/14 0545  NA 132* 134* 132* 136 135  K 3.3* 3.4* 3.3* 3.1* 3.5  CL 101 107 104 104 104  CO2 21 19 23 23 22   GLUCOSE 123* 228* 136* 70 106*  BUN 8 11 8 8 6   CREATININE 0.86 0.66 0.58 0.69 0.69  CALCIUM 8.4 7.9*  7.4* 7.5* 8.0*  MG  --  2.2  --   --   --     Liver Function Tests:  Recent Labs Lab 11/01/14 0417  AST 18  ALT 14  ALKPHOS 76  BILITOT 0.3  PROT 6.3  ALBUMIN 2.8*   CBC:  Recent Labs Lab 10/31/14 0915 11/01/14 0417 11/02/14 0009 11/03/14 0819 11/04/14 0545  WBC 10.5 23.8* 20.5* 11.7* 12.8*  NEUTROABS 9.6*  --   --   --   --   HGB 18.3* 10.6* 10.1* 10.5* 10.7*  HCT 51.6* 31.3* 29.8* 30.3* 31.8*  MCV 82.0 82.6 81.9 79.7 81.3  PLT 102* 193 182 234 274    Cardiac Enzymes:  Recent Labs Lab 11/01/14 0417 11/01/14 1021 11/01/14 1650 11/02/14 1441  TROPONINI 0.36* <0.03 0.13* 0.09*    Microbiology: Results for orders placed or performed during the hospital encounter of 10/31/14  Rapid strep screen     Status: None   Collection Time: 10/31/14  6:29 AM  Result Value Ref Range Status   Streptococcus, Group A Screen (Direct) NEGATIVE NEGATIVE Final    Comment: (NOTE) A Rapid Antigen test may result negative if the antigen level in the sample is below the detection level of this test. The FDA has not cleared this test as a stand-alone test therefore the rapid antigen negative result has reflexed to a  Group A Strep culture.   Culture, Group A Strep     Status: None   Collection Time: 10/31/14  6:29 AM  Result Value Ref Range Status   Specimen Description THROAT  Final   Special Requests NONE  Final   Culture   Final    STREPTOCOCCUS,BETA HEMOLYTIC NOT GROUP A Performed at [facility]    Report Status 11/03/2014 FINAL  Final  Blood culture (routine x 2)     Status: None   Collection Time: 10/31/14 11:18 AM  Result Value Ref Range Status   Specimen Description BLOOD RIGHT FOREARM  Final   Special Requests BOTTLES DRAWN AEROBIC AND ANAEROBIC 5CC  Final   Culture   Final    STREPTOCOCCUS GROUP C Note: Gram Stain Report Called to,Read Back By and Verified With: ELLA RN ON 2C AT 6269 BY CASTC Performed at [facility]    Report Status  11/03/2014 FINAL  Final   Organism ID, Bacteria STREPTOCOCCUS GROUP C  Final      Susceptibility   Streptococcus group c - MIC (ETEST)*    PENICILLIN .032 SENSITIVE Sensitive     * STREPTOCOCCUS GROUP C  Blood culture (routine x 2)     Status: None   Collection Time: 10/31/14 11:26 AM  Result Value Ref Range Status   Specimen Description BLOOD LEFT FOREARM  Final   Special Requests BOTTLES DRAWN AEROBIC AND ANAEROBIC 5CC  Final   Culture   Final    STREPTOCOCCUS GROUP C Note: SUSCEPTIBILITIES PERFORMED ON PREVIOUS CULTURE WITHIN THE LAST 5 DAYS. Note: Gram Stain Report Called to,Read Back By and Verified With: AMANDA PETTIFORD ON 2.9.2016 AT 10:17P BY WILEJ Performed at [facility]    Report Status 11/03/2014 FINAL  Final  MRSA PCR Screening     Status: None   Collection Time: 10/31/14 11:27 PM  Result Value Ref Range Status   MRSA by PCR NEGATIVE NEGATIVE Final    Comment:        The GeneXpert MRSA Assay (FDA approved for NASAL specimens only), is one component of a comprehensive MRSA colonization surveillance program. It is not intended to diagnose MRSA infection nor to guide or monitor treatment for MRSA infections.   Culture, blood (routine x 2)     Status: None (Preliminary result)   Collection Time: 11/02/14  7:25 PM  Result Value Ref Range Status   Specimen Description BLOOD RIGHT FOREARM  Final   Special Requests   Final    BOTTLES DRAWN AEROBIC AND ANAEROBIC 10CC AER,7CC ANA [place]   Culture   Final           BLOOD CULTURE RECEIVED NO GROWTH TO DATE CULTURE WILL BE HELD FOR 5 DAYS BEFORE ISSUING A FINAL NEGATIVE REPORT Performed at [facility]    Report Status PENDING  Incomplete  Culture, blood (routine x 2)     Status: None (Preliminary result)   Collection Time: 11/02/14  7:30 PM  Result Value Ref Range Status   Specimen Description BLOOD RIGHT HAND  Final   Special Requests BOTTLES DRAWN AEROBIC ONLY 10CC  Final   Culture   Final            BLOOD CULTURE RECEIVED NO GROWTH TO DATE CULTURE WILL BE HELD FOR 5 DAYS BEFORE ISSUING A FINAL NEGATIVE REPORT Performed at [facility]    Report Status PENDING  Incomplete    Imaging: No results found.     Medications:  Infusions:    Scheduled Medications: . ampicillin-sulbactam (UNASYN) IV  3 g Intravenous 4 times per day  . aspirin  324 mg Oral Daily  . enoxaparin (LOVENOX) injection  40 mg Subcutaneous Q24H  . sodium chloride  10-40 mL Intracatheter Q12H    PRN Medications: acetaminophen, diphenhydrAMINE, ketorolac, menthol-cetylpyridinium, sodium chloride, traMADol   Assessment/ Plan:    Principal Problem:   Lemierre syndrome Active Problems:   Tobacco abuse   Reactive airway disease   Chiari I malformation   Group C streptococcal infection   Hypokalemia  #Acute pharyngitis with Lemierre's syndrome Throat culture growing beta hemolytic strep, which is consistent with her blood cultures. She remains afebrile with no growth on her repeat blood cultures. No need for TEE according to infectious disease. -Continue Unasyn IV, day 5. She will need to continue for 2 weeks, 9 more days, prior to transitioning to oral antibiotics for an additional 2 weeks. -Follow-up repeat blood cultures. -Continue tramadol and Toradol when necessary for pain. Can transition to ibuprofen as outpatient. -Follow-up final throat culture. -Regular diet. -PICC placed this morning. -Awaiting case manager to arrange outpatient home health RN to train on administration of IV antibiotics. Will discharge at that time.  #Hypokalemia Resolved. -Repeat BMP as outpatient.  #Elevated troponins Due to bacteremia. No need for aspirin now. -Stop aspirin.  #Anaphylaxis due to contrast media -Benadryl every 6 hours as needed.   DVT PPX - low molecular weight heparin and heparin  CODE STATUS - Full.  CONSULTS PLACED - Cardiology.  DISPO - discharge after home health RN  arranged.  The patient does not have a current PCP (Pcp Not In System) and does need an [facility] hospital follow-up appointment after discharge.    Is the [facility] hospital follow-up appointment a one-time only appointment? yes.  Does the patient have transportation limitations that hinder transportation to clinic appointments? no   SERVICE NEEDED AT [place]         Y = Yes, Blank = No PT:   OT:   RN:   Equipment:   Other:      Length of Stay: 4 day(s)   Signed: Arman Filter, MD  PGY-1, Internal Medicine Resident Pager: [phone] (7AM-5PM) 11/04/2014, 1:24 PM

## 2014-11-04 NOTE — Progress Notes (Signed)
Patient discharge instruction reviewed and patient verbalized understanding using teach back.

## 2014-11-04 NOTE — Care Management Note (Addendum)
    Page 1 of 2   11/04/2014     4:10:39 PM CARE MANAGEMENT NOTE 11/04/2014  Patient:  [name] [name]   Account Number:  [id number]  Date Initiated:  11/01/2014  Documentation initiated by:  MAYO,[name] [name]  Subjective/Objective Assessment:   dx Lemierre Syndrome    Primary Care @ [place]     Action/Plan:   HH iv abx   Anticipated DC Date:  11/04/2014   Anticipated DC Plan:  [place]  CM consult      [facility] Choice  HOME HEALTH   Choice offered to / List presented to:  C-1 Patient        [place] arranged  HH-1 RN  IV Antibiotics      [name] [name]   Status of service:  Completed, signed off Medicare Important Message given?  NO (If response is "NO", the following Medicare IM given date fields will be blank) Date Medicare IM given:   Medicare IM given by:   Date Additional Medicare IM given:   Additional Medicare IM given by:    Discharge Disposition:  [place]  Per UR Regulation:  Reviewed for med. necessity/level of care/duration of stay  If discussed at [place] of Stay Meetings, dates discussed:    Comments:  11/04/14 [place], BSN [phone] patient is for dc today, she recieved picc line, NCM faxed h/p, demo, labs, picc line report, and order for iv abx to [facility] infusion, [facility] to [employer] and to [facility].  [facility](option care) will provide medication and supplies per [facility] choice, and [name] [name] will supply [facility] for teaching for iv abx.  11/04/14 [place] RN MSN BSN CCM Documentation faxed to pt's PCP, Dr [name] [name] @ [phone] and to [facility] @ [phone] (phone [phone].  [facility] will arrange home IV antibx when pt ready for discharge.  11/03/14 [place] MSN BSN CCM Per attending, ID does not want PICC inserted until culture results are obtained later today.  D/C tentatively planned for 2/12.  11/02/14 [place] Additional transfer paperwork faxed to [place]. Transfer liaison will discuss clinicals with receiving physician and call CM re anticipated transfer time. 29 TC from Dr [name] [name] @ [facility], discussed clinical information.  Per his request, paged attending who now says that pt changed her mind about transferring and wants to remain here.  TC to Dr [name] [name] to provide information.  TC to [place] [name] as directed to begin process for home IV antibx.  Will fax documentation to [facility] and pt's PCP, Dr [name] [name].  11/01/14 [place] MSN BSN CCM Pt is requesting transfer to [facility]  TC to [name] [name], transfer coordinator, message left with pt information, requested return call.  Notification of admission called to [phone]. [phone] Transfer paperwork faxed by [facility] - attending will address stability to transfer in the a.m.

## 2014-11-04 NOTE — Progress Notes (Signed)
Paged Md not notify per case manager Deborah Patient need to be d/c by 4 pm,

## 2014-11-09 LAB — CULTURE, BLOOD (ROUTINE X 2)
CULTURE: NO GROWTH
CULTURE: NO GROWTH

## 2014-11-11 ENCOUNTER — Encounter (HOSPITAL_COMMUNITY): Payer: Self-pay | Admitting: *Deleted

## 2014-11-11 ENCOUNTER — Emergency Department (HOSPITAL_COMMUNITY)
Admission: EM | Admit: 2014-11-11 | Discharge: 2014-11-11 | Disposition: A | Discharge status: Home / Self Care | Payer: Non-veteran care | Attending: Emergency Medicine | Admitting: Emergency Medicine

## 2014-11-11 DIAGNOSIS — Z79899 Other long term (current) drug therapy: Secondary | ICD-10-CM | POA: Insufficient documentation

## 2014-11-11 DIAGNOSIS — Y998 Other external cause status: Secondary | ICD-10-CM | POA: Insufficient documentation

## 2014-11-11 DIAGNOSIS — Y9389 Activity, other specified: Secondary | ICD-10-CM | POA: Insufficient documentation

## 2014-11-11 DIAGNOSIS — T7840XA Allergy, unspecified, initial encounter: Secondary | ICD-10-CM | POA: Insufficient documentation

## 2014-11-11 DIAGNOSIS — G8929 Other chronic pain: Secondary | ICD-10-CM | POA: Insufficient documentation

## 2014-11-11 DIAGNOSIS — Z720 Tobacco use: Secondary | ICD-10-CM | POA: Insufficient documentation

## 2014-11-11 DIAGNOSIS — X58XXXA Exposure to other specified factors, initial encounter: Secondary | ICD-10-CM | POA: Insufficient documentation

## 2014-11-11 DIAGNOSIS — Z3202 Encounter for pregnancy test, result negative: Secondary | ICD-10-CM | POA: Insufficient documentation

## 2014-11-11 DIAGNOSIS — Y9289 Other specified places as the place of occurrence of the external cause: Secondary | ICD-10-CM | POA: Insufficient documentation

## 2014-11-11 DIAGNOSIS — Z8509 Personal history of malignant neoplasm of other digestive organs: Secondary | ICD-10-CM | POA: Insufficient documentation

## 2014-11-11 DIAGNOSIS — Z8719 Personal history of other diseases of the digestive system: Secondary | ICD-10-CM | POA: Insufficient documentation

## 2014-11-11 LAB — CBC WITH DIFFERENTIAL/PLATELET
BASOS PCT: 0 % (ref 0–1)
Basophils Absolute: 0 10*3/uL (ref 0.0–0.1)
EOS ABS: 0.1 10*3/uL (ref 0.0–0.7)
Eosinophils Relative: 1 % (ref 0–5)
HCT: 34.2 % — ABNORMAL LOW (ref 36.0–46.0)
HEMOGLOBIN: 11.1 g/dL — AB (ref 12.0–15.0)
Lymphocytes Relative: 29 % (ref 12–46)
Lymphs Abs: 3 10*3/uL (ref 0.7–4.0)
MCH: 27.3 pg (ref 26.0–34.0)
MCHC: 32.5 g/dL (ref 30.0–36.0)
MCV: 84 fL (ref 78.0–100.0)
MONOS PCT: 7 % (ref 3–12)
Monocytes Absolute: 0.7 10*3/uL (ref 0.1–1.0)
NEUTROS ABS: 6.5 10*3/uL (ref 1.7–7.7)
NEUTROS PCT: 63 % (ref 43–77)
PLATELETS: 641 10*3/uL — AB (ref 150–400)
RBC: 4.07 MIL/uL (ref 3.87–5.11)
RDW: 15.5 % (ref 11.5–15.5)
WBC: 10.3 10*3/uL (ref 4.0–10.5)

## 2014-11-11 LAB — PREGNANCY, URINE: PREG TEST UR: NEGATIVE

## 2014-11-11 LAB — PROTIME-INR
INR: 0.99 (ref 0.00–1.49)
Prothrombin Time: 13.2 seconds (ref 11.6–15.2)

## 2014-11-11 LAB — BASIC METABOLIC PANEL
Anion gap: 8 (ref 5–15)
BUN: 6 mg/dL (ref 6–23)
CO2: 26 mmol/L (ref 19–32)
Calcium: 9.1 mg/dL (ref 8.4–10.5)
Chloride: 104 mmol/L (ref 96–112)
Creatinine, Ser: 0.72 mg/dL (ref 0.50–1.10)
GFR calc non Af Amer: >90 mL/min (ref >90)
GLUCOSE: 100 mg/dL — AB (ref 70–99)
POTASSIUM: 3.8 mmol/L (ref 3.5–5.1)
SODIUM: 138 mmol/L (ref 135–145)

## 2014-11-11 MED ORDER — FAMOTIDINE 20 MG PO TABS: 20.0000 mg | ORAL_TABLET | Freq: Two times a day (BID) | ORAL | Status: AC

## 2014-11-11 MED ORDER — EPINEPHRINE 0.3 MG/0.3ML IJ SOAJ: 0.3000 mg | Freq: Once | INTRAMUSCULAR | Status: AC

## 2014-11-11 MED ORDER — SODIUM CHLORIDE 0.9 % IV SOLN
3.0000 g | Freq: Once | INTRAVENOUS | Status: AC
  Administered 2014-11-11: 3 g via INTRAVENOUS
  Filled 2014-11-11: qty 3

## 2014-11-11 MED ORDER — HEPARIN SOD (PORK) LOCK FLUSH 100 UNIT/ML IV SOLN
250.0000 [IU] | Freq: Once | INTRAVENOUS | Status: AC
  Administered 2014-11-11: 250 [IU]

## 2014-11-11 MED ORDER — PREDNISONE 10 MG PO TABS: 30.0000 mg | ORAL_TABLET | Freq: Every day | ORAL | Status: DC

## 2014-11-11 MED ORDER — DIPHENHYDRAMINE HCL 25 MG PO CAPS: 25.0000 mg | ORAL_CAPSULE | Freq: Four times a day (QID) | ORAL | Status: AC | PRN

## 2014-11-11 MED ORDER — DIPHENHYDRAMINE HCL 50 MG/ML IJ SOLN
25.0000 mg | Freq: Once | INTRAMUSCULAR | Status: AC
  Administered 2014-11-11: 25 mg via INTRAVENOUS
  Filled 2014-11-11: qty 1

## 2014-11-11 MED ORDER — HEPARIN SOD (PORK) LOCK FLUSH 100 UNIT/ML IV SOLN
500.0000 [IU] | Freq: Once | INTRAVENOUS | Status: DC
  Filled 2014-11-11: qty 5

## 2014-11-11 MED ORDER — METHYLPREDNISOLONE SODIUM SUCC 125 MG IJ SOLR
125.0000 mg | Freq: Once | INTRAMUSCULAR | Status: AC
  Administered 2014-11-11: 125 mg via INTRAVENOUS
  Filled 2014-11-11: qty 2

## 2014-11-11 NOTE — ED Provider Notes (Signed)
CSN: [id number]     Arrival date & time 11/11/14  1403 History   First MD Initiated Contact with Patient 11/11/14 1417     Chief Complaint  Patient presents with  . Allergic Reaction     The history is provided by the patient. No language interpreter was used.   [name] [name] presents for evaluation of allergic reaction.  She currently has a PICC line in her right arm for IV abx following recent hospitalization for Lemier syndrome. Her home health nurse was cleaning her PICC line when she developed hives, periorbital swelling, nausea.  EMS was called and brought her in.  She reports generalized itching, but her symptoms are overall improving.  She received pepcid, benadryl, epinephrine by EMS prior to ED arrival.  Sxs are moderate, constant, improving.    Past Medical History  Diagnosis Date  . Pancreatic tumor   . GERD (gastroesophageal reflux disease)   . Chronic abdominal pain    Past Surgical History  Procedure Laterality Date  . Splenectomy    . Cholecystectomy    . Pancreatectomy      partial   Family History  Problem Relation Age of Onset  . Diabetes Mother   . Cancer Mother     Ovarian, Breast  . Hypertension Mother   . Hypertension Father   . Diabetes Father    History  Substance Use Topics  . Smoking status: Current Every Day Smoker -- 0.50 packs/day for 10 years    Types: Cigarettes  . Smokeless tobacco: Never Used  . Alcohol Use: No   OB History    No data available     Review of Systems  All other systems reviewed and are negative.     Allergies  Contrast media; Strawberry; and Hydrocodone  Home Medications   Prior to Admission medications   Medication Sig Start Date End Date Taking? Authorizing Provider  Ampicillin-Sulbactam 3 g in sodium chloride 0.9 % 100 mL Inject 3 g into the vein every 6 (six) hours. 11/03/14  Yes Arman Filter, MD  HEPARIN LOCK FLUSH IV Inject 3 mLs into the vein every 6 (six) hours. Heparin 60ml/ 300 units- flush after  medication regimen and 73ml normal saline flush iv.   Yes Historical Provider, MD  menthol-cetylpyridinium (CEPACOL) 3 MG lozenge Take 1 lozenge (3 mg total) by mouth as needed for sore throat. 11/04/14  Yes Arman Filter, MD  norethindrone-ethinyl estradiol-iron (MICROGESTIN FE,GILDESS FE,LOESTRIN FE) 1.5-30 MG-MCG tablet Take 1 tablet by mouth daily.   Yes Historical Provider, MD  Sodium Chloride Flush (NORMAL SALINE FLUSH IV) Inject 10 mLs into the vein every 6 (six) hours. Before and after medication regimen   Yes Historical Provider, MD  traMADol (ULTRAM) 50 MG tablet Take 1 tablet (50 mg total) by mouth every 6 (six) hours as needed for moderate pain. 11/04/14  Yes Arman Filter, MD  Cholecalciferol (VITAMIN D) 2000 UNITS CAPS Take 1 capsule by mouth daily.    Historical Provider, MD   BP 126/75 mmHg  Pulse 79  Temp(Src) 98.8 F (37.1 C) (Oral)  Resp 18  SpO2 98%  LMP 10/30/2014 Physical Exam  Constitutional: She is oriented to person, place, and time. She appears well-developed and well-nourished.  HENT:  Head: Normocephalic and atraumatic.  Mild periorbital edema  Cardiovascular: Normal rate and regular rhythm.   No murmur heard. Pulmonary/Chest: Effort normal and breath sounds normal. No respiratory distress.  Abdominal: Soft. There is no tenderness. There is no rebound and no guarding.  Musculoskeletal: She exhibits no edema or tenderness.  PICC line in RUE  Neurological: She is alert and oriented to person, place, and time.  Skin: Skin is warm and dry.  Urticaria over abdominal wall  Psychiatric: She has a normal mood and affect. Her behavior is normal.  Nursing note and vitals reviewed.   ED Course  Procedures (including critical care time) Labs Review Labs Reviewed  BASIC METABOLIC PANEL - Abnormal; Notable for the following:    Glucose, Bld 100 (*)    All other components within normal limits  CBC WITH DIFFERENTIAL/PLATELET - Abnormal; Notable for the following:     Hemoglobin 11.1 (*)    HCT 34.2 (*)    Platelets 641 (*)    All other components within normal limits  PROTIME-INR  PREGNANCY, URINE    Imaging Review No results found.   EKG Interpretation None      MDM   Final diagnoses:  Allergic reaction, initial encounter    Patient here for evaluation of itching, hives, periorbital edema. Patient's symptoms are improving and emergency department after receiving epinephrine IM by EMS prior to ED arrival. Patient is currently not any anaphylaxis. Plan to observe in the emergency department for total of 4 hours following epinephrine injection. The patient continues to be improved plan to DC home on scheduled Benadryl, Pepcid, steroids. Discussed with patient and care for allergic reaction and importance of avoiding iodine containing products. Return for EpiPen was given.  D/w pt recommendation to stay for full four hour observation and patient declines citing a problem with transportation - return precautions discussed.    Quintella Reichert, MD 11/11/14 [phone]

## 2014-11-11 NOTE — ED Notes (Signed)
Per EMS - patient's home health nurse changed dressing on her PICC line at home.  RN cleaned PICC line with betadine and patient began having hives and pruritis with periorbital edema.  Patient has no airway issues.  Patient denies N/V.  Patient received 0.3 Epi IM, 50 mg Benadryl and 50 mg Zantac IV with EMS.  Patient's vitals WNL 126/82, HR 70's, 100% on RA.

## 2014-11-11 NOTE — ED Notes (Signed)
Bed: MB86 Expected date:  Expected time:  Means of arrival:  Comments: Ems- allergic reaction

## 2014-11-11 NOTE — Progress Notes (Signed)
  CARE MANAGEMENT ED NOTE 11/11/2014  Patient:  [name] [name],[name] [name]   Account Number:  [id number]  Date Initiated:  11/11/2014  Documentation initiated by:  Jackelyn Poling  Subjective/Objective Assessment:   29 yr old [employer] covered pt home health nurse changed dressing on her PICC line at home.  RN cleaned PICC line with betadine and patient began having hives and pruritis with periorbital edema.  Patient has no airway issues     Subjective/Objective Assessment Detail:   pcp [facility]  confirmation of fax with d/c summary  to [facility] received 1504 11/11/14     Action/Plan:   ED Cm consulted by pt ED RN when a fac x from [place]ell [place], [facility] RN director sent fax for EDP on pt [facility] plan of care.ED RN noted pt [facility] info sheet does not indicate pt allergy to betadine 1445 CM called, spoke with Joelene Millin to   Action/Plan Detail:   share missing allergy to betadine & to confirm fax is for EDP, Ralene Bathe to review Joelene Millin to update allergy Cm sent fax to [facility] [phone] fax confirmation received at 1457 for list of allergies fax to [facility] pcp updated in EPIC   Anticipated DC Date:  11/11/2014     Status Recommendation to Physician:   Result of Recommendation:    Other ED Services  Consult [facility]  Other  Outpatient Services - Pt will follow up    Choice offered to / List presented to:            Status of service:  Completed, signed off  ED Comments:   ED Comments Detail:

## 2014-11-11 NOTE — Discharge Instructions (Signed)
You are allergic to Betadine, Iodine, IV contrast.  Please let all of your health care providers about your allergy.  Continue your antibiotics as prescribed.    Epinephrine Injection Epinephrine is a medicine given by injection to temporarily treat an emergency allergic reaction. It is also used to treat severe asthmatic attacks and other lung problems. The medicine helps to enlarge (dilate) the small breathing tubes of the lungs. A life-threatening, sudden allergic reaction that involves the whole body is called anaphylaxis. Because of potential side effects, epinephrine should only be used as directed by your caregiver. RISKS AND COMPLICATIONS Possible side effects of epinephrine injections include:  Chest pain.  Irregular or rapid heartbeat.  Shortness of breath.  Nausea.  Vomiting.  Abdominal pain or cramping.  Sweating.  Dizziness.  Weakness.  Headache.  Nervousness. Report all side effects to your caregiver. HOW TO GIVE AN EPINEPHRINE INJECTION Give the epinephrine injection immediately when symptoms of a severe reaction begin. Inject the medicine into the outer thigh or any available, large muscle. Your caregiver can teach you how to do this. You do not need to remove any clothing. After the injection, call your local emergency services (911 in [place] Even if you improve after the injection, you need to be examined at a hospital emergency department. Epinephrine works quickly, but it also wears off quickly. Delayed reactions can occur. A delayed reaction may be as serious and dangerous as the initial reaction. HOME CARE INSTRUCTIONS  Make sure you and your family know how to give an epinephrine injection.  Use epinephrine injections as directed by your caregiver. Do not use this medicine more often or in larger doses than prescribed.  Always carry your epinephrine injection or anaphylaxis kit with you. This can be lifesaving if you have a severe reaction.  Store the  medicine in a cool, dry place. If the medicine becomes discolored or cloudy, dispose of it properly and replace it with new medicine.  Check the expiration date on your medicine. It may be unsafe to use medicines past their expiration date.  Tell your caregiver about any other medicines you are taking. Some medicines can react badly with epinephrine.  Tell your caregiver about any medical conditions you have, such as diabetes, high blood pressure (hypertension), heart disease, irregular heartbeats, or if you are pregnant. SEEK IMMEDIATE MEDICAL CARE IF:  You have used an epinephrine injection. Call your local emergency services (911 in [place] Even if you improve after the injection, you need to be examined at a hospital emergency department to make sure your allergic reaction is under control. You will also be monitored for adverse effects from the medicine.  You have chest pain.  You have irregular or fast heartbeats.  You have shortness of breath.  You have severe headaches.  You have severe nausea, vomiting, or abdominal cramps.  You have severe pain, swelling, or redness in the area where you gave the injection. Document Released: 09/06/2000 Document Revised: 12/02/2011 Document Reviewed: 05/29/2011 [facility] Patient Information 2015 [place], [place]. This information is not intended to replace advice given to you by your health care provider. Make sure you discuss any questions you have with your health care provider.   Drug Allergy Allergic reactions to medicines are common. Some allergic reactions are mild. A delayed type of drug allergy that occurs 1 week or more after exposure to a medicine or vaccine is called serum sickness. A life-threatening, sudden (acute) allergic reaction that involves the whole body is called anaphylaxis. CAUSES  "  True" drug allergies occur when there is an allergic reaction to a medicine. This is caused by overactivity of the immune system. First, the  body becomes sensitized. The immune system is triggered by your first exposure to the medicine. Following this first exposure, future exposure to the same medicine may be life-threatening. Almost any medicine can cause an allergic reaction. Common ones are:  Penicillin.  Sulfonamides (sulfa drugs).  Local anesthetics.  X-ray dyes that contain iodine. SYMPTOMS  Common symptoms of a minor allergic reaction are:  Swelling around the mouth.  An itchy red rash or hives.  Vomiting or diarrhea. Anaphylaxis can cause swelling of the mouth and throat. This makes it difficult to breathe and swallow. Severe reactions can be fatal within seconds, even after exposure to only a trace amount of the drug that causes the reaction. HOME CARE INSTRUCTIONS   If you are unsure of what caused your reaction, keep a diary of foods and medicines used. Include the symptoms that followed. Avoid anything that causes reactions.  You may want to follow up with an allergy specialist after the reaction has cleared in order to be tested to confirm the allergy. It is important to confirm that your reaction is an allergy, not just a side effect to the medicine. If you have a true allergy to a medicine, this may prevent that medicine and related medicines from being given to you when you are very ill.  If you have hives or a rash:  Take medicines as directed by your caregiver.  You may use an over-the-counter antihistamine (diphenhydramine) as needed.  Apply cold compresses to the skin or take baths in cool water. Avoid hot baths or showers.  If you are severely allergic:  Continuous observation after a severe reaction may be needed. Hospitalization is often required.  Wear a medical alert bracelet or necklace stating your allergy.  You and your family must learn how to use an anaphylaxis kit or give an epinephrine injection to temporarily treat an emergency allergic reaction. If you have had a severe reaction,  always carry your epinephrine injection or anaphylaxis kit with you. This can be lifesaving if you have a severe reaction.  Do not drive or perform tasks after treatment until the medicines used to treat your reaction have worn off, or until your caregiver says it is okay. SEEK MEDICAL CARE IF:   You think you had an allergic reaction. Symptoms usually start within 30 minutes after exposure.  Symptoms are getting worse rather than better.  You develop new symptoms.  The symptoms that brought you to your caregiver return. SEEK IMMEDIATE MEDICAL CARE IF:   You have swelling of the mouth, difficulty breathing, or wheezing.  You have a tight feeling in your chest or throat.  You develop hives, swelling, or itching all over your body.  You develop severe vomiting or diarrhea.  You feel faint or pass out. This is an emergency. Use your epinephrine injection or anaphylaxis kit as you have been instructed. Call for emergency medical help. Even if you improve after the injection, you need to be examined at a hospital emergency department. MAKE SURE YOU:   Understand these instructions.  Will watch your condition.  Will get help right away if you are not doing well or get worse. Document Released: 09/09/2005 Document Revised: 12/02/2011 Document Reviewed: 02/13/2011 [facility] Patient Information 2015 [place], [place]. This information is not intended to replace advice given to you by your health care provider. Make sure you  discuss any questions you have with your health care provider.

## 2014-11-11 NOTE — ED Notes (Signed)
Patient is aware that a urine sample is needed. Will follow up shortly after pharmacy is out of the room.

## 2014-11-11 NOTE — ED Notes (Signed)
EPIC EMR will not allow this RN to document patient's departure condition. Patient departure condition is good, patient departed in cab. Patient denies pain at this time. Discharge education performed, medications discussed, pain management discussed, follow up appointments discussed. Patient verbalized understanding.

## 2015-03-29 ENCOUNTER — Encounter: Payer: Self-pay | Admitting: Internal Medicine

## 2015-10-04 ENCOUNTER — Emergency Department (HOSPITAL_COMMUNITY)
Admission: EM | Admit: 2015-10-04 | Discharge: 2015-10-04 | Disposition: A | Discharge status: Home / Self Care | Payer: Non-veteran care | Attending: Emergency Medicine | Admitting: Emergency Medicine

## 2015-10-04 ENCOUNTER — Emergency Department (HOSPITAL_COMMUNITY): Payer: Non-veteran care

## 2015-10-04 ENCOUNTER — Encounter (HOSPITAL_COMMUNITY): Payer: Self-pay | Admitting: Emergency Medicine

## 2015-10-04 DIAGNOSIS — Z3202 Encounter for pregnancy test, result negative: Secondary | ICD-10-CM | POA: Insufficient documentation

## 2015-10-04 DIAGNOSIS — F17210 Nicotine dependence, cigarettes, uncomplicated: Secondary | ICD-10-CM | POA: Diagnosis not present

## 2015-10-04 DIAGNOSIS — G8929 Other chronic pain: Secondary | ICD-10-CM | POA: Diagnosis not present

## 2015-10-04 DIAGNOSIS — K219 Gastro-esophageal reflux disease without esophagitis: Secondary | ICD-10-CM | POA: Diagnosis not present

## 2015-10-04 DIAGNOSIS — R509 Fever, unspecified: Secondary | ICD-10-CM | POA: Diagnosis present

## 2015-10-04 DIAGNOSIS — B349 Viral infection, unspecified: Secondary | ICD-10-CM | POA: Insufficient documentation

## 2015-10-04 DIAGNOSIS — Z79899 Other long term (current) drug therapy: Secondary | ICD-10-CM | POA: Insufficient documentation

## 2015-10-04 DIAGNOSIS — Z79818 Long term (current) use of other agents affecting estrogen receptors and estrogen levels: Secondary | ICD-10-CM | POA: Insufficient documentation

## 2015-10-04 DIAGNOSIS — Z86018 Personal history of other benign neoplasm: Secondary | ICD-10-CM | POA: Diagnosis not present

## 2015-10-04 LAB — POC URINE PREG, ED: Preg Test, Ur: NEGATIVE

## 2015-10-04 MED ORDER — BENZONATATE 100 MG PO CAPS
100.0000 mg | ORAL_CAPSULE | Freq: Once | ORAL | Status: AC
  Administered 2015-10-04: 100 mg via ORAL
  Filled 2015-10-04: qty 1

## 2015-10-04 MED ORDER — ACETAMINOPHEN 500 MG PO TABS
1000.0000 mg | ORAL_TABLET | Freq: Once | ORAL | Status: AC
  Administered 2015-10-04: 1000 mg via ORAL
  Filled 2015-10-04: qty 2

## 2015-10-04 MED ORDER — KETOROLAC TROMETHAMINE 60 MG/2ML IM SOLN
60.0000 mg | Freq: Once | INTRAMUSCULAR | Status: AC
  Administered 2015-10-04: 60 mg via INTRAMUSCULAR
  Filled 2015-10-04: qty 2

## 2015-10-04 NOTE — Discharge Instructions (Signed)
Take tylenol 2 pills 4 times a day and motrin 4 pills 3 times a day.  Drink plenty of fluids.  Return for worsening shortness of breath, headache, confusion. Follow up with your family doctor.   Viral Infections A viral infection can be caused by different types of viruses.Most viral infections are not serious and resolve on their own. However, some infections may cause severe symptoms and may lead to further complications. SYMPTOMS Viruses can frequently cause:  Minor sore throat.  Aches and pains.  Headaches.  Runny nose.  Different types of rashes.  Watery eyes.  Tiredness.  Cough.  Loss of appetite.  Gastrointestinal infections, resulting in nausea, vomiting, and diarrhea. These symptoms do not respond to antibiotics because the infection is not caused by bacteria. However, you might catch a bacterial infection following the viral infection. This is sometimes called a "superinfection." Symptoms of such a bacterial infection may include:  Worsening sore throat with pus and difficulty swallowing.  Swollen neck glands.  Chills and a high or persistent fever.  Severe headache.  Tenderness over the sinuses.  Persistent overall ill feeling (malaise), muscle aches, and tiredness (fatigue).  Persistent cough.  Yellow, green, or brown mucus production with coughing. HOME CARE INSTRUCTIONS   Only take over-the-counter or prescription medicines for pain, discomfort, diarrhea, or fever as directed by your caregiver.  Drink enough water and fluids to keep your urine clear or pale yellow. Sports drinks can provide valuable electrolytes, sugars, and hydration.  Get plenty of rest and maintain proper nutrition. Soups and broths with crackers or rice are fine. SEEK IMMEDIATE MEDICAL CARE IF:   You have severe headaches, shortness of breath, chest pain, neck pain, or an unusual rash.  You have uncontrolled vomiting, diarrhea, or you are unable to keep down fluids.  You or  your child has an oral temperature above 102 F (38.9 C), not controlled by medicine.  Your baby is older than 3 months with a rectal temperature of 102 F (38.9 C) or higher.  Your baby is 29 months old or younger with a rectal temperature of 100.4 F (38 C) or higher. MAKE SURE YOU:   Understand these instructions.  Will watch your condition.  Will get help right away if you are not doing well or get worse.   This information is not intended to replace advice given to you by your health care provider. Make sure you discuss any questions you have with your health care provider.   Document Released: 06/19/2005 Document Revised: 12/02/2011 Document Reviewed: 02/15/2015 Elsevier Interactive Patient Education [employer].

## 2015-10-04 NOTE — ED Notes (Signed)
Per [facility] pt has flu like symptoms started yesterday , tingling all over, cough, pain all over.

## 2015-10-04 NOTE — ED Provider Notes (Signed)
CSN: [id number]     Arrival date & time 10/04/15  0902 History   First MD Initiated Contact with Patient 10/04/15 [phone]     Chief Complaint  Patient presents with  . flu like symptoms      (Consider location/radiation/quality/duration/timing/severity/associated sxs/prior Treatment) Patient is a 30 y.o. female presenting with general illness. The history is provided by the patient.  Illness Severity:  Moderate Onset quality:  Sudden Duration:  1 day Timing:  Constant Progression:  Unchanged Chronicity:  New Associated symptoms: congestion, cough, fever and myalgias   Associated symptoms: no chest pain, no headaches, no nausea, no rhinorrhea, no shortness of breath, no vomiting and no wheezing    30 yo F with a chief complaints of all over body aches cough and fever and chills. This been going on just since this morning. Patient last night didn't have an appetite denied any other symptoms. Today woke up in the middle the night feeling like she is having hot and cold flashes as well as myalgias all over. Patient denies any sick contacts. Denies likelihood of being pregnant. Also describes some tingling all over her body. History of splenectomy. States she has had all of her vaccines.   Past Medical History  Diagnosis Date  . Pancreatic tumor   . GERD (gastroesophageal reflux disease)   . Chronic abdominal pain    Past Surgical History  Procedure Laterality Date  . Splenectomy    . Cholecystectomy    . Pancreatectomy      partial   Family History  Problem Relation Age of Onset  . Diabetes Mother   . Cancer Mother     Ovarian, Breast  . Hypertension Mother   . Hypertension Father   . Diabetes Father    Social History  Substance Use Topics  . Smoking status: Current Every Day Smoker -- 0.50 packs/day for 10 years    Types: Cigarettes  . Smokeless tobacco: Never Used  . Alcohol Use: No   OB History    No data available     Review of Systems  Constitutional: Positive  for fever. Negative for chills.  HENT: Positive for congestion. Negative for rhinorrhea.   Eyes: Negative for redness and visual disturbance.  Respiratory: Positive for cough. Negative for shortness of breath and wheezing.   Cardiovascular: Negative for chest pain and palpitations.  Gastrointestinal: Negative for nausea and vomiting.  Genitourinary: Negative for dysuria and urgency.  Musculoskeletal: Positive for myalgias. Negative for arthralgias.  Skin: Negative for pallor and wound.  Neurological: Negative for dizziness and headaches.      Allergies  Contrast media; Strawberry extract; and Hydrocodone  Home Medications   Prior to Admission medications   Medication Sig Start Date End Date Taking? Authorizing Provider  diphenhydrAMINE (BENADRYL) 25 mg capsule Take 1 capsule (25 mg total) by mouth every 6 (six) hours as needed. Patient taking differently: Take 25 mg by mouth every 6 (six) hours as needed for allergies.  11/11/14  Yes Quintella Reichert, MD  EPINEPHrine 0.3 mg/0.3 mL IJ SOAJ injection Inject 0.3 mLs (0.3 mg total) into the muscle once. Patient taking differently: Inject 0.3 mg into the muscle daily as needed (allergic reaction).  11/11/14  Yes Quintella Reichert, MD  traMADol (ULTRAM) 50 MG tablet Take 1 tablet (50 mg total) by mouth every 6 (six) hours as needed for moderate pain. 11/04/14  Yes Langley Gauss Moding, MD  Ampicillin-Sulbactam 3 g in sodium chloride 0.9 % 100 mL Inject 3 g into the  vein every 6 (six) hours. 11/03/14   Charlesetta Shanks, MD  Cholecalciferol (VITAMIN D) 2000 UNITS CAPS Take 1 capsule by mouth daily.    Historical Provider, MD  famotidine (PEPCID) 20 MG tablet Take 1 tablet (20 mg total) by mouth 2 (two) times daily. Patient not taking: Reported on 10/04/2015 11/11/14   Quintella Reichert, MD  norethindrone-ethinyl estradiol-iron (MICROGESTIN FE,GILDESS FE,LOESTRIN FE) 1.5-30 MG-MCG tablet Take 1 tablet by mouth daily.    Historical Provider, MD   BP 112/77 mmHg   Pulse 71  Temp(Src) 98.5 F (36.9 C) (Oral)  Resp 14  SpO2 100% Physical Exam  Constitutional: She is oriented to person, place, and time. She appears well-developed and well-nourished. No distress.  HENT:  Head: Normocephalic and atraumatic.  Eyes: EOM are normal. Pupils are equal, round, and reactive to light.  Neck: Normal range of motion. Neck supple.  Cardiovascular: Normal rate and regular rhythm.  Exam reveals no gallop and no friction rub.   No murmur heard. Pulmonary/Chest: Effort normal. She has no wheezes. She has no rales.  Abdominal: Soft. She exhibits no distension. There is no tenderness. There is no rebound and no guarding.  Musculoskeletal: She exhibits no edema or tenderness.  Neurological: She is alert and oriented to person, place, and time. She has normal strength. No cranial nerve deficit or sensory deficit. GCS eye subscore is 4. GCS verbal subscore is 5. GCS motor subscore is 6. She displays no Babinski's sign on the right side. She displays no Babinski's sign on the left side.  Reflex Scores:      Tricep reflexes are 2+ on the right side and 2+ on the left side.      Bicep reflexes are 2+ on the right side and 2+ on the left side.      Brachioradialis reflexes are 2+ on the right side and 2+ on the left side.      Patellar reflexes are 2+ on the right side and 2+ on the left side.      Achilles reflexes are 2+ on the right side and 2+ on the left side. Skin: Skin is warm and dry. She is not diaphoretic.  Psychiatric: She has a normal mood and affect. Her behavior is normal.  Nursing note and vitals reviewed.   ED Course  Procedures (including critical care time) Labs Review Labs Reviewed  POC URINE PREG, ED    Imaging Review Dg Chest 2 View  10/04/2015  CLINICAL DATA:  Cough, fever and tingling for a few days. EXAM: CHEST  2 VIEW COMPARISON:  PA and lateral chest 10/31/2014. FINDINGS: Heart size and mediastinal contours are within normal limits. Both  lungs are clear. Visualized skeletal structures are unremarkable. IMPRESSION: No acute disease. Electronically Signed   By: Inge Rise M.D.   On: 10/04/2015 09:43   I have personally reviewed and evaluated these images and lab results as part of my medical decision-making.   EKG Interpretation None      MDM   Final diagnoses:  Viral illness    30 yo F with a chief complaint of cough congestion subjective fevers and chills. Patient symptoms likely correspond a viral illness. Obtain a chest x-ray to rule out pneumonia. Treat with NSAIDs.  Patient with mild improvement with Tylenol. Continuing to have the myalgias. Patient states she continues to have some paresthesias. Hyperventilating on exam. Discussed with patient. Will discharge her home.  11:53 AM:  I have discussed the diagnosis/risks/treatment options with the patient  and family and believe the pt to be eligible for discharge home to follow-up with PCP. We also discussed returning to the ED immediately if new or worsening sx occur. We discussed the sx which are most concerning (e.g., sudden worsening pain, fever, inability to tolerate by mouth) that necessitate immediate return. Medications administered to the patient during their visit and any new prescriptions provided to the patient are listed below.  Medications given during this visit Medications  ketorolac (TORADOL) injection 60 mg (not administered)  acetaminophen (TYLENOL) tablet 1,000 mg (1,000 mg Oral Given 10/04/15 0942)  benzonatate (TESSALON) capsule 100 mg (100 mg Oral Given 10/04/15 0942)    New Prescriptions   No medications on file    The patient appears reasonably screen and/or stabilized for discharge and I doubt any other medical condition or other [facility] requiring further screening, evaluation, or treatment in the ED at this time prior to discharge.    Deno Etienne, DO 10/04/15 1153

## 2015-10-04 NOTE — ED Notes (Signed)
Pt given ginger ale and saltines. Pt hesitant to eat or drink for fear of vomiting.

## 2016-12-26 IMAGING — CT CT NECK W/ CM
4 of 5 series · 16 of 33 positions shown, 18 images · IV contrast (Omni 300)
Comparison: None.

CLINICAL DATA: Sore throat, mild cough, and knot on the right side
of neck - onset 10/29/2014

EXAM:
CT NECK WITH CONTRAST
TECHNIQUE: Multidetector CT imaging of the neck was performed using the
standard protocol following the bolus administration of intravenous
contrast.
CONTRAST:  75mL OMNIPAQUE IOHEXOL 300 MG/ML  SOLN

[Series 2: neck 2.0 i31s 3 · axial · 0.46mm/px · z∈[-255,-129]mm · 4 of 106 slices shown, 5 images]
[im 22/106  soft-tissue]
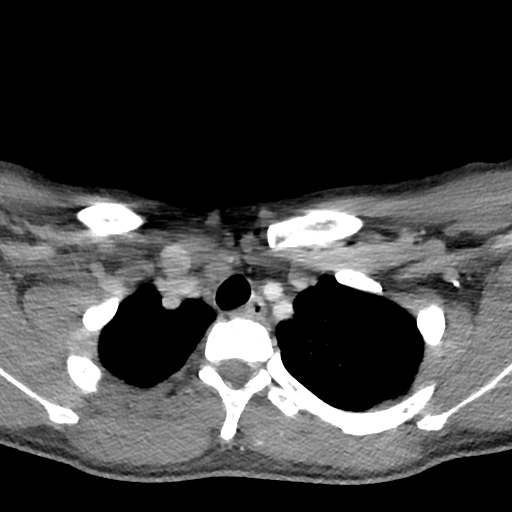
[im 22/106  bone]
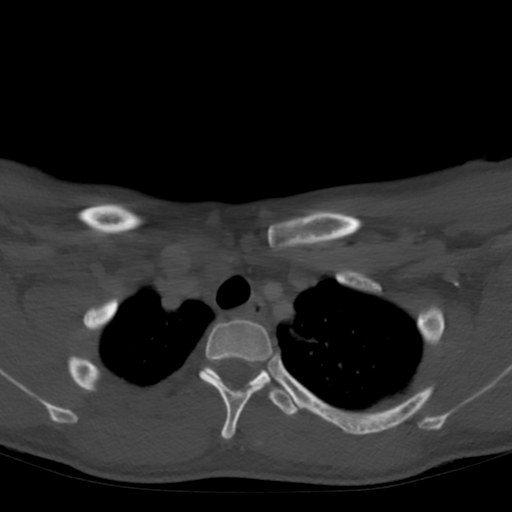
[im 43/106  bone]
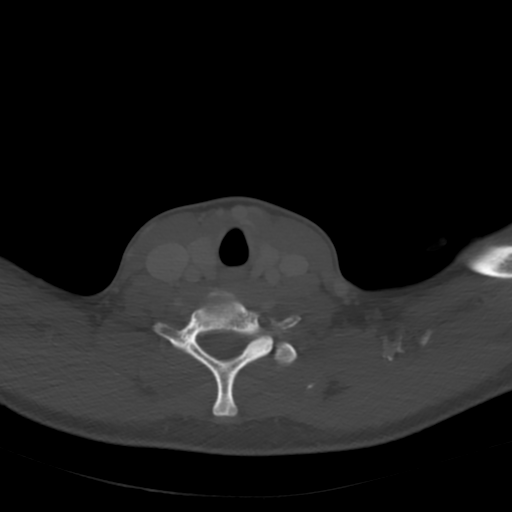
[im 64/106  bone]
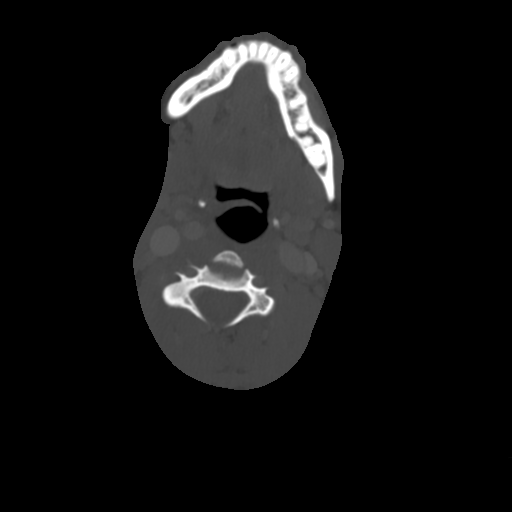
[im 85/106  bone]
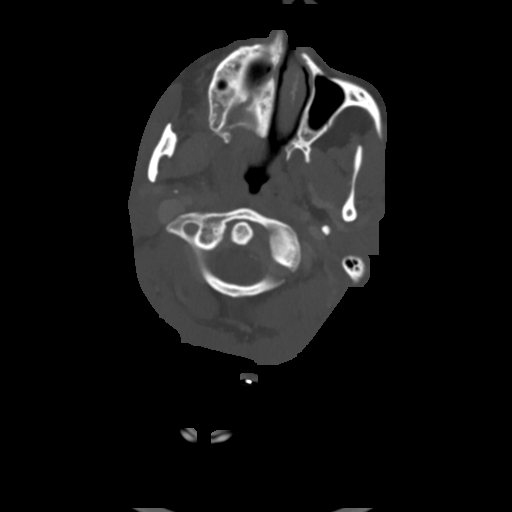

[Series 5: coronal st · coronal · 0.48mm/px · 3 of 92 slices shown]
[im 19/92  bone]
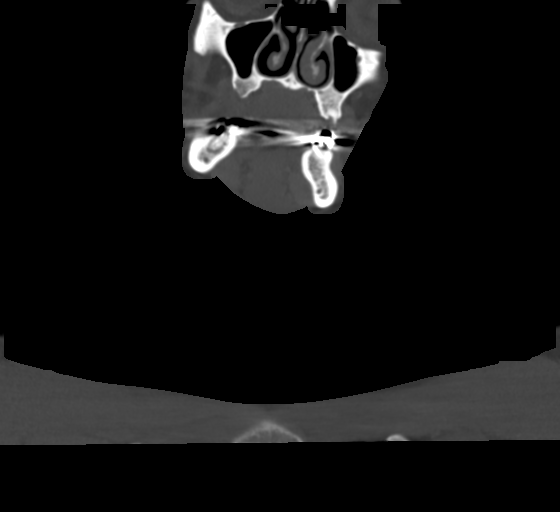
[im 37/92  bone]
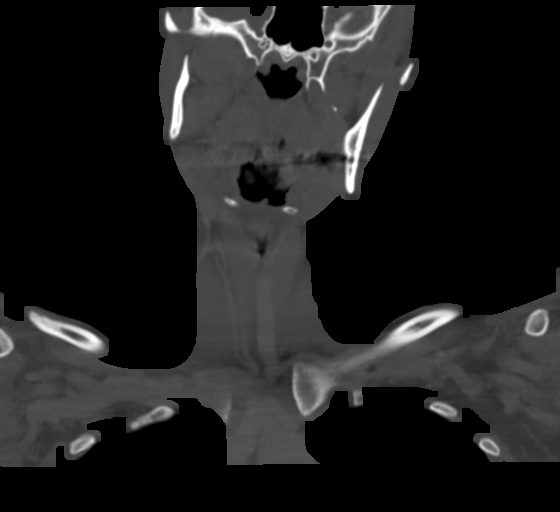
[im 55/92  bone]
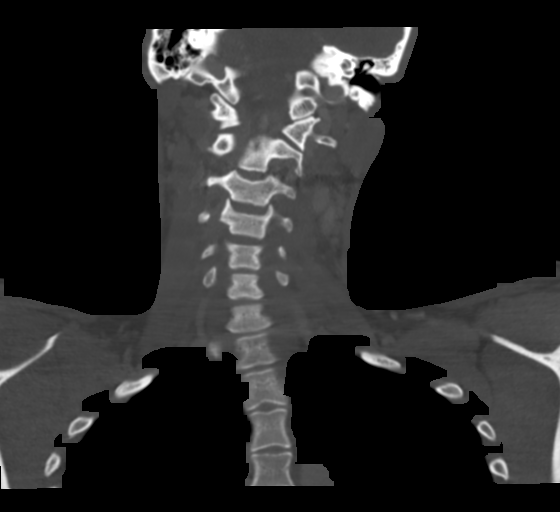

[Series 6: sagittal st · sagittal · 0.44mm/px · 5 of 101 slices shown, 6 images]
[im 34/101  bone]
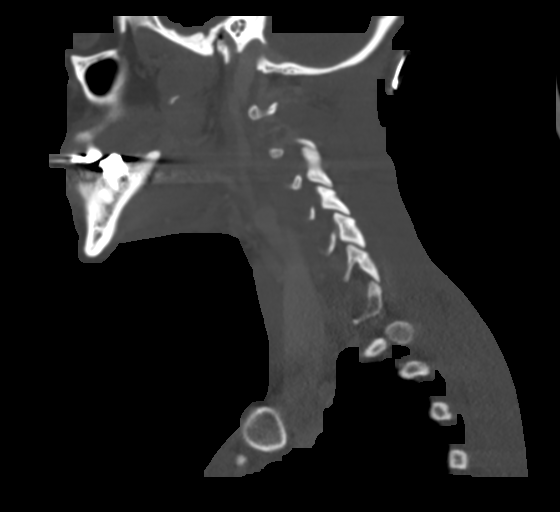
[im 42/101  bone]
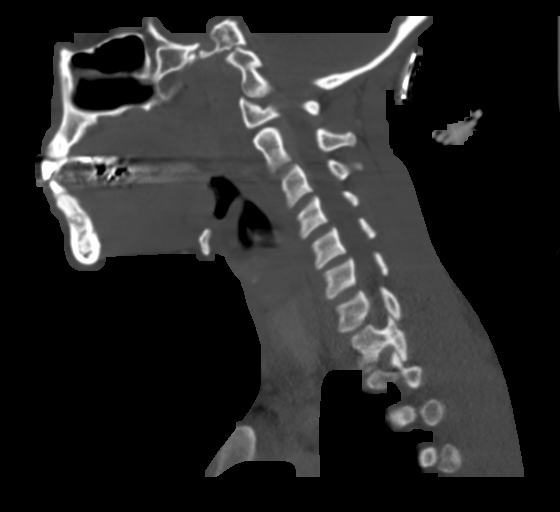
[im 51/101  soft-tissue]
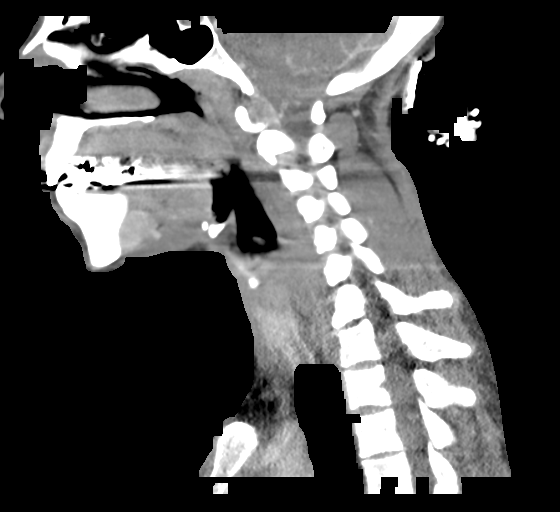
[im 51/101  bone]
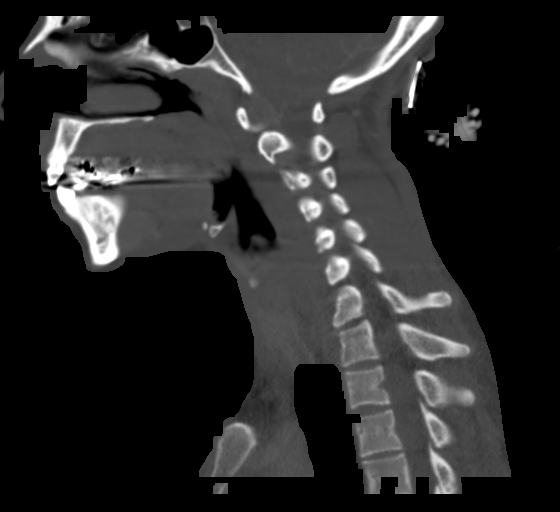
[im 59/101  bone]
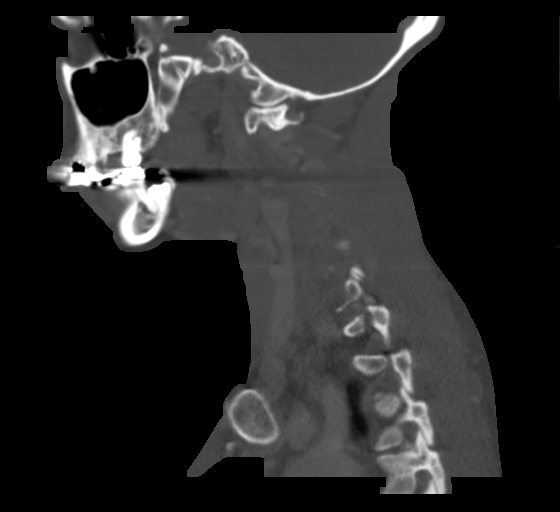
[im 67/101  bone]
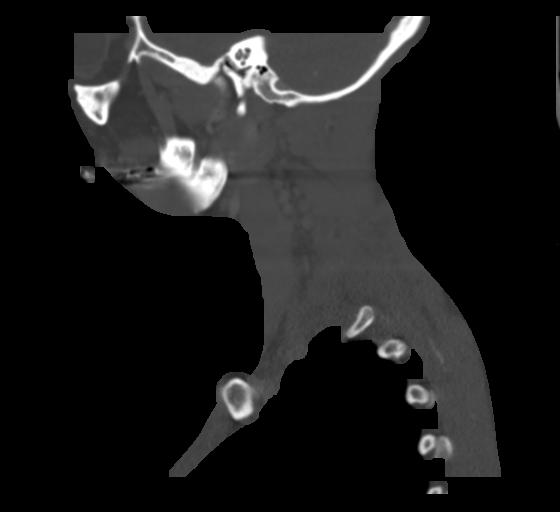

[Series 7: orthogonal st · axial · 0.45mm/px · z∈[-287,-156]mm · 4 of 120 slices shown]
[im 24/120  bone]
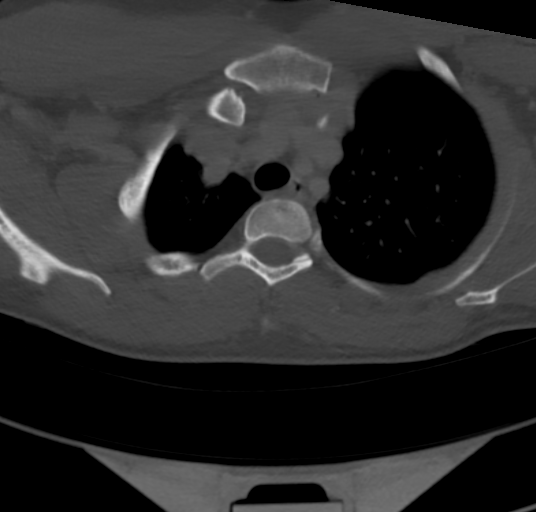
[im 48/120  bone]
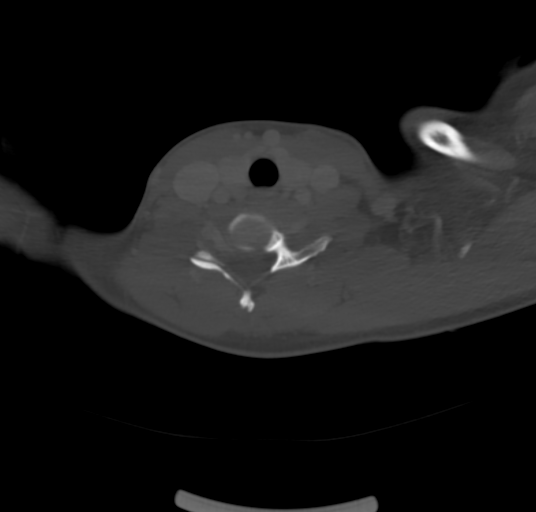
[im 72/120  bone]
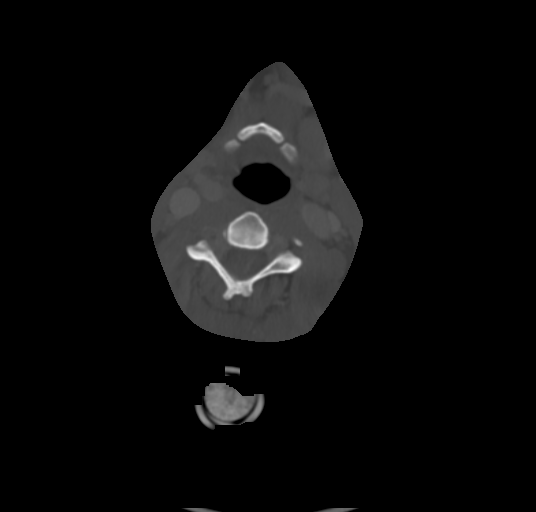
[im 96/120  bone]
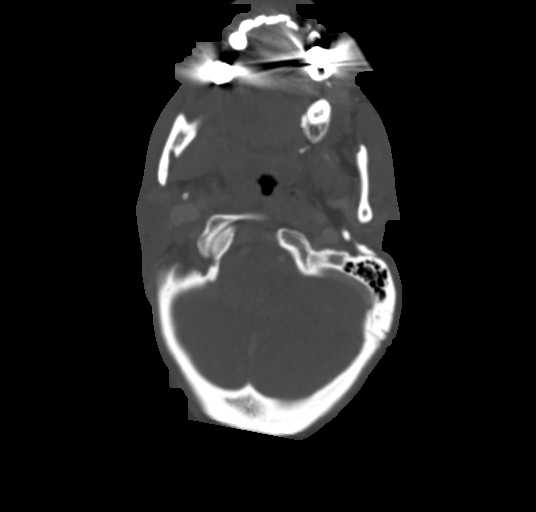

[16 of 33 positions shown; findings below may reference images not displayed]

FINDINGS: Pharynx and larynx: There is fat edema surrounding the pharynx, with
mild enlargement of the tonsils, asymmetric to the right (where
there is also likely pus in the tonsillar crypts). Reactive cervical
lymphadenopathy without suppurative change. In the region of maximal
inflammation in the right neck, there is an eccentric,
well-circumscribed contrast filling defect most consistent with
early clot. This appearance is not typical for a mixing/flow
artifact or valve. No retropharyngeal abscess or phlegmon.

Salivary glands: No primary inflammation

Thyroid: Negative

Lymph nodes: Reactive enlargement, as above.

Vascular: Right IJ partial thrombosis, as above

Limited intracranial: Low cerebellar tonsils without foramen magnum
crowding typical of acute Chiari 1. Incidental developmental venous
anomaly in the inferior left cerebellum.

Visualized orbits: Negative

Mastoids and visualized paranasal sinuses: Negative

Skeleton: Negative

Upper chest: A subsegmental bronchus in the left upper lobe is
opacified, indistinct, and mildly expanded. No other bronchiectatic
airways.
IMPRESSION: 1. Tonsillitis/pharyngitis with parapharyngeal edema and adenopathy.
Small thrombus in the right IJ, neighboring the maximal
inflammation.
2. Opacified and possibly expanded left upper lobe subsegmental
bronchus, chronic airway inflammation (including bronchopulmonary
aspergillosis) versus bronchopneumonia. The appearance is not
typical of septic embolus related to #1.

## 2017-11-29 IMAGING — CR DG CHEST 2V
2 series · 2 of 2 positions shown · non-contrast
Comparison: PA and lateral chest 10/31/2014.

CLINICAL DATA: Cough, fever and tingling for a few days.

EXAM:
CHEST  2 VIEW

[x chest ap]
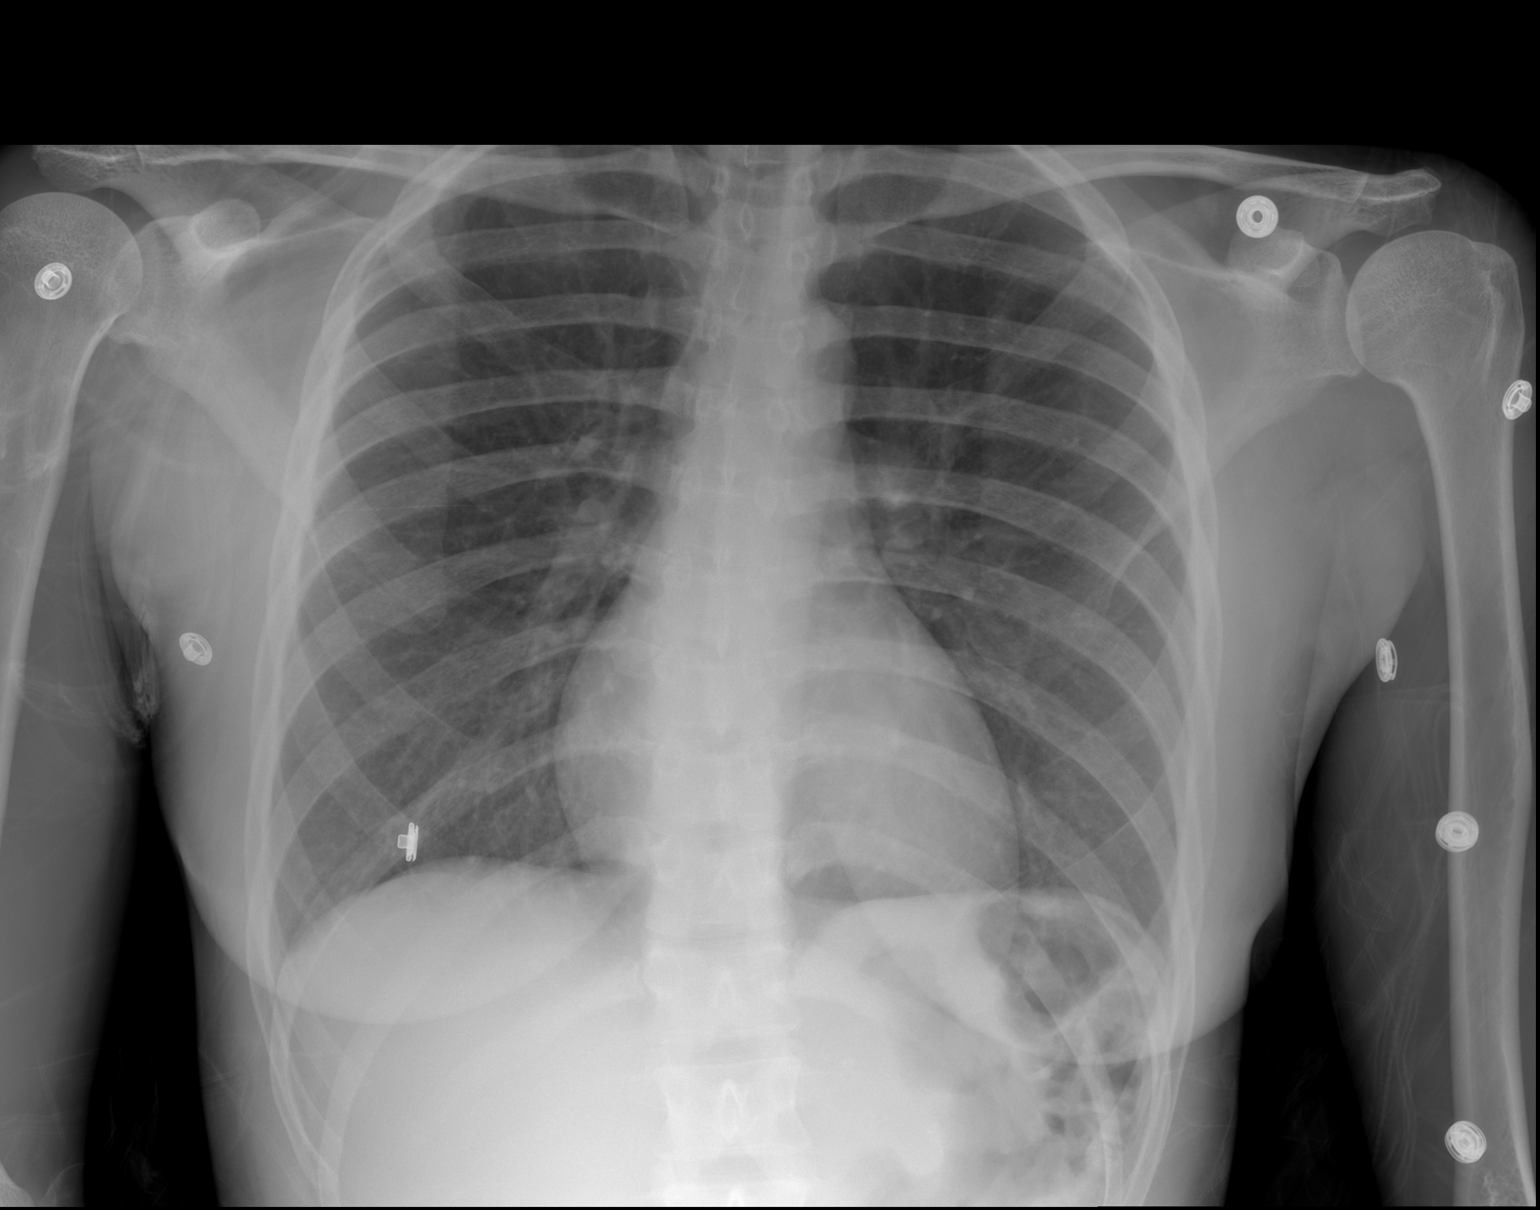

[w chest lat]
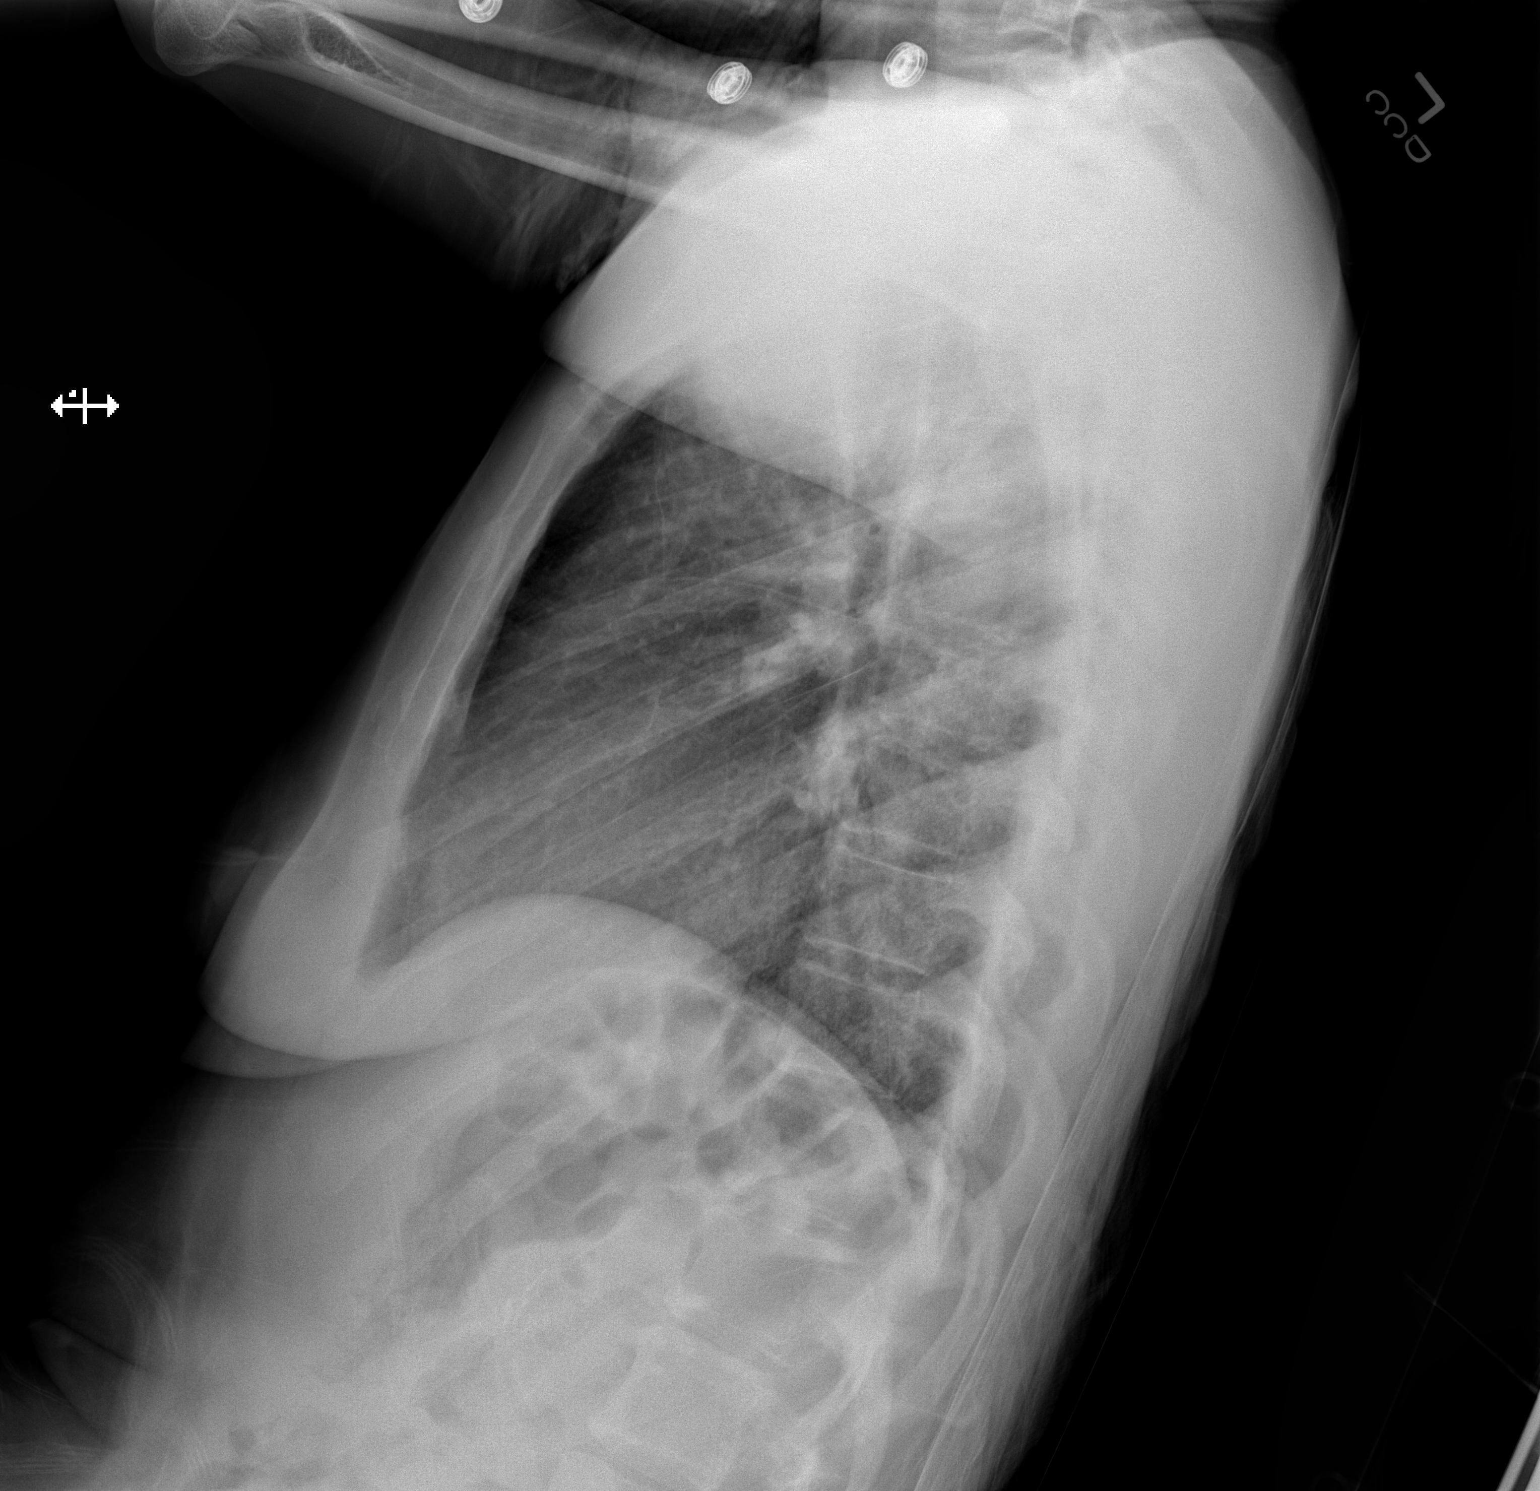

[2 of 2 positions shown; findings below may reference images not displayed]

FINDINGS: Heart size and mediastinal contours are within normal limits. Both
lungs are clear. Visualized skeletal structures are unremarkable.
IMPRESSION: No acute disease.
